# Patient Record
Sex: Male | Born: 1955 | Race: White | Hispanic: No | Smoking: Never smoker
Health system: Southern US, Community
[De-identification: ages and names within clinical notes are randomized; demographics above are authoritative.]

## PROBLEM LIST (undated history)

## (undated) DIAGNOSIS — E079 Disorder of thyroid, unspecified: Secondary | ICD-10-CM

## (undated) DIAGNOSIS — M65331 Trigger finger, right middle finger: Secondary | ICD-10-CM

## (undated) DIAGNOSIS — Z860100 Personal history of colon polyps, unspecified: Secondary | ICD-10-CM

## (undated) DIAGNOSIS — I00 Rheumatic fever without heart involvement: Secondary | ICD-10-CM

## (undated) DIAGNOSIS — I10 Essential (primary) hypertension: Secondary | ICD-10-CM

## (undated) DIAGNOSIS — Z5189 Encounter for other specified aftercare: Secondary | ICD-10-CM

## (undated) DIAGNOSIS — T7840XA Allergy, unspecified, initial encounter: Secondary | ICD-10-CM

## (undated) DIAGNOSIS — Z85810 Personal history of malignant neoplasm of tongue: Secondary | ICD-10-CM

## (undated) DIAGNOSIS — E785 Hyperlipidemia, unspecified: Secondary | ICD-10-CM

## (undated) DIAGNOSIS — E559 Vitamin D deficiency, unspecified: Secondary | ICD-10-CM

## (undated) DIAGNOSIS — R001 Bradycardia, unspecified: Secondary | ICD-10-CM

## (undated) DIAGNOSIS — M7542 Impingement syndrome of left shoulder: Secondary | ICD-10-CM

## (undated) DIAGNOSIS — C760 Malignant neoplasm of head, face and neck: Secondary | ICD-10-CM

## (undated) DIAGNOSIS — K219 Gastro-esophageal reflux disease without esophagitis: Secondary | ICD-10-CM

## (undated) DIAGNOSIS — R0781 Pleurodynia: Secondary | ICD-10-CM

## (undated) DIAGNOSIS — R1310 Dysphagia, unspecified: Secondary | ICD-10-CM

## (undated) DIAGNOSIS — R55 Syncope and collapse: Secondary | ICD-10-CM

## (undated) HISTORY — DX: Dysphagia, unspecified: R13.10

## (undated) HISTORY — DX: Hyperlipidemia, unspecified: E78.5

## (undated) HISTORY — DX: Impingement syndrome of left shoulder: M75.42

## (undated) HISTORY — DX: Allergy, unspecified, initial encounter: T78.40XA

## (undated) HISTORY — DX: Bradycardia, unspecified: R00.1

## (undated) HISTORY — DX: Vitamin D deficiency, unspecified: E55.9

## (undated) HISTORY — DX: Personal history of colon polyps, unspecified: Z86.0100

## (undated) HISTORY — PX: OTHER SURGICAL HISTORY: SHX169

## (undated) HISTORY — DX: Syncope and collapse: R55

## (undated) HISTORY — DX: Gastro-esophageal reflux disease without esophagitis: K21.9

## (undated) HISTORY — DX: Rheumatic fever without heart involvement: I00

## (undated) HISTORY — DX: Personal history of colonic polyps: Z86.010

## (undated) HISTORY — DX: Pleurodynia: R07.81

## (undated) HISTORY — DX: Encounter for other specified aftercare: Z51.89

## (undated) HISTORY — DX: Malignant neoplasm of head, face and neck: C76.0

## (undated) HISTORY — DX: Trigger finger, right middle finger: M65.331

## (undated) HISTORY — DX: Personal history of malignant neoplasm of tongue: Z85.810

## (undated) HISTORY — PX: NECK SURGERY: SHX720

---

## 1998-01-15 DIAGNOSIS — C069 Malignant neoplasm of mouth, unspecified: Secondary | ICD-10-CM

## 1998-01-15 HISTORY — DX: Malignant neoplasm of mouth, unspecified: C06.9

## 1998-09-16 ENCOUNTER — Ambulatory Visit (HOSPITAL_BASED_OUTPATIENT_CLINIC_OR_DEPARTMENT_OTHER): Admission: RE | Admit: 1998-09-16 | Discharge: 1998-09-16 | Payer: Self-pay | Admitting: Periodontics

## 1998-10-06 ENCOUNTER — Encounter: Admission: RE | Admit: 1998-10-06 | Discharge: 1999-01-04 | Payer: Self-pay | Admitting: Radiation Oncology

## 1998-10-06 ENCOUNTER — Encounter (HOSPITAL_COMMUNITY): Admission: RE | Admit: 1998-10-06 | Discharge: 1999-01-04 | Payer: Self-pay | Admitting: Dentistry

## 1998-10-06 ENCOUNTER — Encounter: Payer: Self-pay | Admitting: Radiation Oncology

## 1998-10-26 ENCOUNTER — Inpatient Hospital Stay (HOSPITAL_COMMUNITY): Admission: AD | Admit: 1998-10-26 | Discharge: 1998-10-31 | Payer: Self-pay | Admitting: Hematology and Oncology

## 1998-11-05 ENCOUNTER — Observation Stay (HOSPITAL_COMMUNITY): Admission: AD | Admit: 1998-11-05 | Discharge: 1998-11-05 | Payer: Self-pay | Admitting: Radiation Oncology

## 1998-11-07 ENCOUNTER — Ambulatory Visit (HOSPITAL_COMMUNITY): Admission: RE | Admit: 1998-11-07 | Discharge: 1998-11-07 | Payer: Self-pay | Admitting: Radiation Oncology

## 1999-02-20 ENCOUNTER — Encounter: Payer: Self-pay | Admitting: Hematology and Oncology

## 1999-02-20 ENCOUNTER — Encounter: Admission: RE | Admit: 1999-02-20 | Discharge: 1999-02-20 | Payer: Self-pay | Admitting: Hematology and Oncology

## 1999-03-23 ENCOUNTER — Ambulatory Visit (HOSPITAL_COMMUNITY): Admission: RE | Admit: 1999-03-23 | Discharge: 1999-03-23 | Payer: Self-pay | Admitting: Family Medicine

## 1999-05-26 ENCOUNTER — Encounter (HOSPITAL_COMMUNITY): Admission: RE | Admit: 1999-05-26 | Discharge: 1999-08-24 | Payer: Self-pay | Admitting: Dentistry

## 1999-06-01 ENCOUNTER — Encounter (HOSPITAL_COMMUNITY): Payer: Self-pay | Admitting: Dentistry

## 1999-06-21 ENCOUNTER — Encounter: Payer: Self-pay | Admitting: *Deleted

## 1999-06-21 ENCOUNTER — Encounter: Admission: RE | Admit: 1999-06-21 | Discharge: 1999-06-21 | Payer: Self-pay | Admitting: *Deleted

## 2000-06-20 ENCOUNTER — Ambulatory Visit: Admission: RE | Admit: 2000-06-20 | Discharge: 2000-08-14 | Payer: Self-pay | Admitting: Radiation Oncology

## 2000-08-15 ENCOUNTER — Ambulatory Visit: Admission: RE | Admit: 2000-08-15 | Discharge: 2000-11-13 | Payer: Self-pay | Admitting: Radiation Oncology

## 2001-06-28 ENCOUNTER — Encounter: Payer: Self-pay | Admitting: Emergency Medicine

## 2001-06-28 ENCOUNTER — Emergency Department (HOSPITAL_COMMUNITY): Admission: EM | Admit: 2001-06-28 | Discharge: 2001-06-28 | Payer: Self-pay | Admitting: Emergency Medicine

## 2001-09-22 ENCOUNTER — Encounter: Admission: RE | Admit: 2001-09-22 | Discharge: 2001-09-22 | Payer: Self-pay | Admitting: *Deleted

## 2001-09-22 ENCOUNTER — Encounter: Payer: Self-pay | Admitting: *Deleted

## 2003-08-31 ENCOUNTER — Encounter: Admission: RE | Admit: 2003-08-31 | Discharge: 2003-08-31 | Payer: Self-pay | Admitting: *Deleted

## 2003-09-06 ENCOUNTER — Encounter: Admission: RE | Admit: 2003-09-06 | Discharge: 2003-09-06 | Payer: Self-pay | Admitting: *Deleted

## 2004-09-19 ENCOUNTER — Encounter: Admission: RE | Admit: 2004-09-19 | Discharge: 2004-09-19 | Payer: Self-pay | Admitting: Otolaryngology

## 2005-03-13 ENCOUNTER — Ambulatory Visit: Payer: Self-pay | Admitting: Gastroenterology

## 2005-03-23 ENCOUNTER — Ambulatory Visit: Payer: Self-pay | Admitting: Gastroenterology

## 2005-03-23 ENCOUNTER — Encounter (INDEPENDENT_AMBULATORY_CARE_PROVIDER_SITE_OTHER): Payer: Self-pay | Admitting: Specialist

## 2005-06-22 ENCOUNTER — Ambulatory Visit: Payer: Self-pay | Admitting: Family Medicine

## 2005-10-16 ENCOUNTER — Other Ambulatory Visit: Admission: RE | Admit: 2005-10-16 | Discharge: 2005-10-16 | Payer: Self-pay | Admitting: Otolaryngology

## 2005-10-26 ENCOUNTER — Encounter: Admission: RE | Admit: 2005-10-26 | Discharge: 2005-10-26 | Payer: Self-pay | Admitting: Otolaryngology

## 2005-11-27 ENCOUNTER — Emergency Department (HOSPITAL_COMMUNITY): Admission: EM | Admit: 2005-11-27 | Discharge: 2005-11-27 | Payer: Self-pay | Admitting: Emergency Medicine

## 2006-01-15 HISTORY — PX: SPLENECTOMY, TOTAL: SHX788

## 2006-06-05 ENCOUNTER — Encounter: Admission: RE | Admit: 2006-06-05 | Discharge: 2006-06-05 | Payer: Self-pay | Admitting: Surgery

## 2006-06-25 ENCOUNTER — Ambulatory Visit (HOSPITAL_COMMUNITY): Admission: RE | Admit: 2006-06-25 | Discharge: 2006-06-25 | Payer: Self-pay | Admitting: Surgery

## 2006-07-01 ENCOUNTER — Encounter: Admission: RE | Admit: 2006-07-01 | Discharge: 2006-07-01 | Payer: Self-pay | Admitting: Family Medicine

## 2006-07-25 ENCOUNTER — Encounter: Admission: RE | Admit: 2006-07-25 | Discharge: 2006-07-25 | Payer: Self-pay | Admitting: Surgery

## 2006-12-09 ENCOUNTER — Encounter: Admission: AD | Admit: 2006-12-09 | Discharge: 2006-12-09 | Payer: Self-pay | Admitting: Dentistry

## 2006-12-09 ENCOUNTER — Ambulatory Visit: Payer: Self-pay | Admitting: Dentistry

## 2007-01-30 ENCOUNTER — Encounter: Admission: RE | Admit: 2007-01-30 | Discharge: 2007-01-30 | Payer: Self-pay | Admitting: Neurosurgery
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
## 2010-03-16 DIAGNOSIS — E559 Vitamin D deficiency, unspecified: Secondary | ICD-10-CM | POA: Insufficient documentation

## 2010-03-16 DIAGNOSIS — E039 Hypothyroidism, unspecified: Secondary | ICD-10-CM | POA: Insufficient documentation

## 2010-03-16 DIAGNOSIS — F528 Other sexual dysfunction not due to a substance or known physiological condition: Secondary | ICD-10-CM | POA: Insufficient documentation

## 2010-03-16 HISTORY — DX: Other sexual dysfunction not due to a substance or known physiological condition: F52.8

## 2011-04-27 DIAGNOSIS — I10 Essential (primary) hypertension: Secondary | ICD-10-CM | POA: Insufficient documentation

## 2011-06-18 DIAGNOSIS — M19019 Primary osteoarthritis, unspecified shoulder: Secondary | ICD-10-CM | POA: Insufficient documentation

## 2011-09-22 ENCOUNTER — Emergency Department (HOSPITAL_COMMUNITY)
Admission: EM | Admit: 2011-09-22 | Discharge: 2011-09-22 | Disposition: A | Discharge status: Home / Self Care | Payer: BC Managed Care – PPO | Attending: Emergency Medicine | Admitting: Emergency Medicine

## 2011-09-22 ENCOUNTER — Emergency Department (HOSPITAL_COMMUNITY): Payer: BC Managed Care – PPO

## 2011-09-22 ENCOUNTER — Encounter (HOSPITAL_COMMUNITY): Payer: Self-pay | Admitting: Emergency Medicine

## 2011-09-22 DIAGNOSIS — S62609B Fracture of unspecified phalanx of unspecified finger, initial encounter for open fracture: Secondary | ICD-10-CM

## 2011-09-22 DIAGNOSIS — S62609A Fracture of unspecified phalanx of unspecified finger, initial encounter for closed fracture: Secondary | ICD-10-CM | POA: Insufficient documentation

## 2011-09-22 DIAGNOSIS — S61209A Unspecified open wound of unspecified finger without damage to nail, initial encounter: Secondary | ICD-10-CM | POA: Insufficient documentation

## 2011-09-22 DIAGNOSIS — W010XXA Fall on same level from slipping, tripping and stumbling without subsequent striking against object, initial encounter: Secondary | ICD-10-CM | POA: Insufficient documentation

## 2011-09-22 DIAGNOSIS — W19XXXA Unspecified fall, initial encounter: Secondary | ICD-10-CM

## 2011-09-22 DIAGNOSIS — R209 Unspecified disturbances of skin sensation: Secondary | ICD-10-CM | POA: Insufficient documentation

## 2011-09-22 HISTORY — DX: Essential (primary) hypertension: I10

## 2011-09-22 HISTORY — DX: Disorder of thyroid, unspecified: E07.9

## 2011-09-22 MED ORDER — OXYCODONE-ACETAMINOPHEN 5-325 MG PO TABS
1.0000 | ORAL_TABLET | Freq: Once | ORAL | Status: AC
  Administered 2011-09-22: 1 via ORAL
  Filled 2011-09-22: qty 1

## 2011-09-22 MED ORDER — CEPHALEXIN 500 MG PO CAPS: 500.0000 mg | ORAL_CAPSULE | Freq: Four times a day (QID) | ORAL | Status: AC

## 2011-09-22 MED ORDER — CEPHALEXIN 500 MG PO CAPS
500.0000 mg | ORAL_CAPSULE | Freq: Once | ORAL | Status: AC
  Administered 2011-09-22: 500 mg via ORAL
  Filled 2011-09-22: qty 1

## 2011-09-22 MED ORDER — OXYCODONE-ACETAMINOPHEN 5-325 MG PO TABS: 1.0000 | ORAL_TABLET | ORAL | Status: AC | PRN

## 2011-09-22 NOTE — ED Notes (Signed)
Pt presenting to ed with c/o pushing mower and falling down an embankment pt with laceration to left index finger. Pt with abrasions noted to right knee. Pt with laceration to lip. Pt denies loc. Pt is alert and oriented at this time

## 2011-09-22 NOTE — ED Provider Notes (Signed)
History     CSN: [id number]  Arrival date & time 09/22/11  7846   First MD Initiated Contact with Patient 09/22/11 1011      Chief Complaint  Patient presents with  . Laceration    (Consider location/radiation/quality/duration/timing/severity/associated sxs/prior treatment) HPI Comments: Patient reports he was walking down a hill covered in ivy, pushing a lawnmower, when his foot got caught in the ivy and he fell forward.  States he fell forward while holding onto the lawnmower handle resulting in a large laceration to left index finger and injury to right 5th finger nailbed.  Reports decreased sensation to left index finger distal to injury.  States he also hit his face on cement.  Pt denies any other injury - denies headache, facial pain, neck or back pain.   Denies any dental pain or fracture, denies malocclusion or jaw pain.    Patient is a 56 y.o. male presenting with skin laceration. The history is provided by the patient.  Laceration     Past Medical History  Diagnosis Date  . Hypertension   . Thyroid disease     Past Surgical History  Procedure Date  . Spleenectomy   . Neck surgery     No family history on file.  History  Substance Use Topics  . Smoking status: Never Smoker   . Smokeless tobacco: Not on file  . Alcohol Use: No     occassionally      Review of Systems  HENT: Negative for neck pain.   Musculoskeletal: Negative for back pain.  Neurological: Positive for numbness. Negative for syncope, weakness and headaches.    Allergies  Penicillins  Home Medications  No current outpatient prescriptions on file.  BP 159/100  Pulse 112  Temp 98.6 F (37 C) (Oral)  Resp 21  SpO2 97%  Physical Exam  Nursing note and vitals reviewed. Constitutional: He appears well-developed and well-nourished. No distress.  HENT:  Head: Normocephalic and atraumatic.  Neck: Neck supple.  Pulmonary/Chest: Effort normal.  Musculoskeletal:       Cervical back: He  exhibits no bony tenderness.       Thoracic back: He exhibits no bony tenderness.       Lumbar back: He exhibits no bony tenderness.       Right upper arm: He exhibits no tenderness and no bony tenderness.       Left upper arm: He exhibits no tenderness and no bony tenderness.       Right forearm: He exhibits no tenderness and no bony tenderness.       Left forearm: He exhibits no tenderness and no bony tenderness.       Hands: Neurological: He is alert.       CN II-XII intact, EOMs intact, no pronator drift, grip strengths equal bilaterally; strength 5/5 in all extremities, sensation intact in all extremities; finger to nose, heel to shin, rapid alternating movements normal; gait is normal.     Skin: He is not diaphoretic.    ED Course  Procedures (including critical care time)  Labs Reviewed - No data to display Dg Finger Index Left  09/22/2011  *RADIOLOGY REPORT*  Clinical Data: Laceration  LEFT INDEX FINGER 2+V  Comparison: None.  Findings:  There is a comminuted, compound fracture of the tuft of the distal phalanx of the second digit (index finger).  This finding is associated with a soft tissue defect involving the nail bed.  No definite radiopaque foreign body.  No extension of  the fracture into the DIP joint.  Joint spaces are preserved.  No additional fractures identified.  IMPRESSION: Comminuted, compound fracture of the tuft of the second digit (index finger) with involvement of the nail bed.  No intra- articular extension or radiopaque foreign body.   Original Report Authenticated By: Waynard Reeds, M.D.    Dg Finger Little Right  09/22/2011  *RADIOLOGY REPORT*  Clinical Data: Injury to the nail bed of the right fifth finger. Evaluate for fracture or foreign body.  RIGHT LITTLE FINGER 2+V  Comparison: No priors.  Findings: Three views of the right fifth finger demonstrate a minimally displaced and angulated fracture of the base of the distal phalanx.  Specifically, there appears to  be 2 mm of volar displacement of the distal fracture fragment and approximately 15 degrees of volar angulation.  No other acute fracture is noted. Overlying soft tissues are swollen. No retained radiopaque foreign body in the soft tissues.  IMPRESSION: 1.  Acute minimally displaced and angulated fracture of the base of the fifth distal phalanx.   Original Report Authenticated By: Florencia Reasons, M.D.     10:40 AM Discussed patient with Dr Fonnie Jarvis.   Patient also seen by Dr Fonnie Jarvis who has recommended repair in ED with sutures over skin, dermabond over nail, outpatient hand follow up.    After percocet and digital block, blood pressure much improved, 150s/60s.    LACERATION REPAIR Performed by: Trixie Dredge B   Authorized by: Trixie Dredge B Consent: Verbal consent obtained. Risks and benefits: risks, benefits and alternatives were discussed Consent given by: patient Patient identity confirmed: provided demographic data Prepped and Draped in normal sterile fashion Wound explored  Laceration Location: left index finger  Laceration Length: 3cm  No Foreign Bodies seen or palpated  Anesthesia: digital block.  (Digital block performed by Alena Bills, PA-S)  Local anesthetic: lidocaine 2% no epinephrine  Anesthetic total: 5 ml  Irrigation method: syringe Amount of cleaning: standard  Skin closure: 5-0 prolene  Number of sutures: 9  Technique: simple interrupted  Also used dermabond over nailbed to attempt to hold nail in place.    Patient tolerance: Patient tolerated the procedure well with no immediate complications.   1. Open fracture of finger   2. Fracture of finger   3. Fall      MDM  Patient with mechanical fall today, sustaining open fracture to left index finger (distal phalanx) and fracture of right 5th finger distal phalanx with partial fingernail avulsion.  Pt is neurologically intact.  Denies any other injury, no other obvious injury noted on exam.  Left finger  with mildly decreased sensation but with full AROM and strength, capillary refill < 2 seconds.  Left finger repaired in ED.  Wounds thoroughly cleaned and dressed, finger splints placed.  Tetanus UTD.  Pt d/c home with keflex (first dose given in ED) and hand follow up.  Discussed all results, wound care plan and follow up with patient.  Pt given return precautions.  Pt verbalizes understanding and agrees with plan.           [place], [place] 09/22/11 1553

## 2011-09-22 NOTE — ED Provider Notes (Signed)
Medical screening examination/treatment/procedure(s) were conducted as a shared visit with non-physician practitioner(s) and myself.  I personally evaluated the patient during the encounter  Open Fx lac distal to DIP feel OutPt hand f/u reasonable after wound closure in ED.  Hurman Horn, MD 09/22/11 2056

## 2011-10-01 ENCOUNTER — Encounter: Payer: Self-pay | Admitting: Gastroenterology

## 2011-10-02 ENCOUNTER — Encounter: Payer: Self-pay | Admitting: Gastroenterology

## 2011-10-15 DIAGNOSIS — S62639B Displaced fracture of distal phalanx of unspecified finger, initial encounter for open fracture: Secondary | ICD-10-CM | POA: Insufficient documentation

## 2011-10-15 HISTORY — DX: Displaced fracture of distal phalanx of unspecified finger, initial encounter for open fracture: S62.639B

## 2011-11-02 ENCOUNTER — Ambulatory Visit (AMBULATORY_SURGERY_CENTER): Payer: BC Managed Care – PPO | Admitting: *Deleted

## 2011-11-02 ENCOUNTER — Telehealth: Payer: Self-pay | Admitting: *Deleted

## 2011-11-02 ENCOUNTER — Encounter: Payer: Self-pay | Admitting: Gastroenterology

## 2011-11-02 VITALS — Ht 69.5 in | Wt 206.2 lb

## 2011-11-02 DIAGNOSIS — Z1211 Encounter for screening for malignant neoplasm of colon: Secondary | ICD-10-CM

## 2011-11-02 NOTE — Telephone Encounter (Signed)
Pt scheduled for office visit with Amy Esterwood Tuesday 10/22 at 3:00 pm.  Pt notified of appt.

## 2011-11-02 NOTE — Telephone Encounter (Signed)
Dr. Christella Hartigan: pt scheduled for recall colonoscopy 11/1.  Pt is adamant that he cannot drink MoviPrep because of volume.  He has a history of tongue cancer and he says he has problems drinking large volumes of liquids.  He did Osmoprep with last colonoscopy but is now taking Lisinopril so he cannot take the osmoprep.  Please advise. Thanks, Olegario Messier

## 2011-11-02 NOTE — Telephone Encounter (Signed)
Please schedule him for ROV with me or extender in next 1-2 weeks to discuss adequate prep.  Can keep the 11/1 colonoscopy appt for now.

## 2011-11-06 ENCOUNTER — Ambulatory Visit (INDEPENDENT_AMBULATORY_CARE_PROVIDER_SITE_OTHER): Payer: BC Managed Care – PPO | Admitting: Physician Assistant

## 2011-11-06 ENCOUNTER — Encounter: Payer: Self-pay | Admitting: Gastroenterology

## 2011-11-06 ENCOUNTER — Encounter: Payer: Self-pay | Admitting: Physician Assistant

## 2011-11-06 VITALS — BP 108/68 | HR 72 | Ht 69.5 in | Wt 206.6 lb

## 2011-11-06 DIAGNOSIS — Z85810 Personal history of malignant neoplasm of tongue: Secondary | ICD-10-CM

## 2011-11-06 DIAGNOSIS — Z1211 Encounter for screening for malignant neoplasm of colon: Secondary | ICD-10-CM

## 2011-11-06 DIAGNOSIS — Z860100 Personal history of colon polyps, unspecified: Secondary | ICD-10-CM

## 2011-11-06 DIAGNOSIS — Z9081 Acquired absence of spleen: Secondary | ICD-10-CM | POA: Insufficient documentation

## 2011-11-06 DIAGNOSIS — Z9089 Acquired absence of other organs: Secondary | ICD-10-CM

## 2011-11-06 DIAGNOSIS — I10 Essential (primary) hypertension: Secondary | ICD-10-CM | POA: Insufficient documentation

## 2011-11-06 DIAGNOSIS — Z860101 Personal history of adenomatous and serrated colon polyps: Secondary | ICD-10-CM | POA: Insufficient documentation

## 2011-11-06 MED ORDER — MOVIPREP 100 G PO SOLR: 1.0000 | Freq: Once | ORAL | Status: DC

## 2011-11-06 NOTE — Patient Instructions (Addendum)
You have been scheduled for a colonoscopy. Please follow written instructions given to you at your visit today.  Please pick up your prep kit at the pharmacy within the next 1-3 days. If you use inhalers (even only as needed), please bring them with you on the day of your procedure.  We have sent the following medications to your pharmacy for you to pick up at your convenience: Moviprep
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
## 2011-11-07 NOTE — Progress Notes (Signed)
I agree with the plan outlined in this note 

## 2011-11-16 ENCOUNTER — Ambulatory Visit (AMBULATORY_SURGERY_CENTER): Payer: BC Managed Care – PPO | Admitting: Gastroenterology

## 2011-11-16 ENCOUNTER — Encounter: Payer: Self-pay | Admitting: Gastroenterology

## 2011-11-16 VITALS — BP 101/73 | HR 68 | Temp 96.9°F | Resp 13 | Ht 69.0 in | Wt 206.0 lb

## 2011-11-16 DIAGNOSIS — K5730 Diverticulosis of large intestine without perforation or abscess without bleeding: Secondary | ICD-10-CM

## 2011-11-16 DIAGNOSIS — Z1211 Encounter for screening for malignant neoplasm of colon: Secondary | ICD-10-CM

## 2011-11-16 DIAGNOSIS — D126 Benign neoplasm of colon, unspecified: Secondary | ICD-10-CM

## 2011-11-16 DIAGNOSIS — Z860100 Personal history of colon polyps, unspecified: Secondary | ICD-10-CM

## 2011-11-16 MED ORDER — SODIUM CHLORIDE 0.9 % IV SOLN: 500.0000 mL | INTRAVENOUS | Status: DC

## 2011-11-16 NOTE — Op Note (Signed)
[facility] [address] [place], [address]   COLONOSCOPY PROCEDURE REPORT  PATIENT: Keith Thompson, Keith Thompson  MR#: [id number] BIRTHDATE: 1955/10/12 , 56  yrs. old GENDER: Male ENDOSCOPIST: Rachael Fee, MD PROCEDURE DATE:  11/16/2011 PROCEDURE:   Colonoscopy with snare polypectomy ASA CLASS:   Class III INDICATIONS:2007 adenomatous polyp. MEDICATIONS: Fentanyl 75 mcg IV, Versed 7 mg IV, and Zofran 4 mg IV   DESCRIPTION OF PROCEDURE:   After the risks benefits and alternatives of the procedure were thoroughly explained, informed consent was obtained.  A digital rectal exam revealed no abnormalities of the rectum.   The LB [id number]  endoscope was introduced through the anus and advanced to the cecum, which was identified by both the appendix and ileocecal valve. No adverse events experienced.   The quality of the prep was good.  The instrument was then slowly withdrawn as the colon was fully examined.  COLON FINDINGS: Three small polyps were found, all were removed and all were sent to pathology.  These were 2-22mm across, sessile, located in ascending and rectal segments.   Mild diverticulosis was noted in the left colon.   The colon mucosa was otherwise normal. Retroflexed views revealed no abnormalities. The time to cecum=2 minutes 08 seconds.  Withdrawal time=9 minutes 30 seconds.  The scope was withdrawn and the procedure completed. COMPLICATIONS: There were no complications.  ENDOSCOPIC IMPRESSION: 1.   Three small polyps were found, all were removed and all were sent to pathology. 2.   Mild diverticulosis was noted in the left colon 3.   The colon mucosa was otherwise normal  RECOMMENDATIONS: 1.  Given your personal history of adenomatous (pre-cancerous) polyps, you will need a repeat colonoscopy in 3-5 years even if the polyps removed today are not precancerous 2.  You will receive a letter within 1-2 weeks with the results of your biopsy as  well as final recommendations.  Please call my office if you have not received a letter after 3 weeks.   eSigned:  Rachael Fee, MD 11/16/2011 9:43 AM   cc: Maryelizabeth Rowan, MD

## 2011-11-16 NOTE — Progress Notes (Signed)
Patient did not experience any of the following events: a burn prior to discharge; a fall within the facility; wrong site/side/patient/procedure/implant event; or a hospital transfer or hospital admission upon discharge from the facility. (G8907) Patient did not have preoperative order for IV antibiotic SSI prophylaxis. (G8918)  

## 2011-11-16 NOTE — Patient Instructions (Addendum)
Colon polyps x 3 removed today, diverticulosis seen. See handouts. Resume current medications. Call us with any questions or concerns. Thank you!!   YOU HAD AN ENDOSCOPIC PROCEDURE TODAY AT THE [facility]: Refer to the procedure report that was given to you for any specific questions about what was found during the examination.  If the procedure report does not answer your questions, please call your gastroenterologist to clarify.  If you requested that your care partner not be given the details of your procedure findings, then the procedure report has been included in a sealed envelope for you to review at your convenience later.  YOU SHOULD EXPECT: Some feelings of bloating in the abdomen. Passage of more gas than usual.  Walking can help get rid of the air that was put into your GI tract during the procedure and reduce the bloating. If you had a lower endoscopy (such as a colonoscopy or flexible sigmoidoscopy) you may notice spotting of blood in your stool or on the toilet paper. If you underwent a bowel prep for your procedure, then you may not have a normal bowel movement for a few days.  DIET: Your first meal following the procedure should be a light meal and then it is ok to progress to your normal diet.  A half-sandwich or bowl of soup is an example of a good first meal.  Heavy or fried foods are harder to digest and may make you feel nauseous or bloated.  Likewise meals heavy in dairy and vegetables can cause extra gas to form and this can also increase the bloating.  Drink plenty of fluids but you should avoid alcoholic beverages for 24 hours.  ACTIVITY: Your care partner should take you home directly after the procedure.  You should plan to take it easy, moving slowly for the rest of the day.  You can resume normal activity the day after the procedure however you should NOT DRIVE or use heavy machinery for 24 hours (because of the sedation medicines used during the test).     SYMPTOMS TO REPORT IMMEDIATELY: A gastroenterologist can be reached at any hour.  During normal business hours, 8:30 AM to 5:00 PM Monday through Friday, call [phone].  After hours and on weekends, please call the GI answering service at [phone] who will take a message and have the physician on call contact you.   Following lower endoscopy (colonoscopy or flexible sigmoidoscopy):  Excessive amounts of blood in the stool  Significant tenderness or worsening of abdominal pains  Swelling of the abdomen that is new, acute  Fever of 100F or higher  FOLLOW UP: If any biopsies were taken you will be contacted by phone or by letter within the next 1-3 weeks.  Call your gastroenterologist if you have not heard about the biopsies in 3 weeks.  Our staff will call the home number listed on your records the next business day following your procedure to check on you and address any questions or concerns that you may have at that time regarding the information given to you following your procedure. This is a courtesy call and so if there is no answer at the home number and we have not heard from you through the emergency physician on call, we will assume that you have returned to your regular daily activities without incident.  SIGNATURES/CONFIDENTIALITY: You and/or your care partner have signed paperwork which will be entered into your electronic medical record.  These signatures attest to the fact  that that the information above on your After Visit Summary has been reviewed and is understood.  Full responsibility of the confidentiality of this discharge information lies with you and/or your care-partner.

## 2011-11-19 ENCOUNTER — Telehealth: Payer: Self-pay

## 2011-11-19 NOTE — Telephone Encounter (Signed)
Left message on answering machine. 

## 2011-11-21 ENCOUNTER — Encounter: Payer: Self-pay | Admitting: Gastroenterology

## 2011-11-29 DIAGNOSIS — K5790 Diverticulosis of intestine, part unspecified, without perforation or abscess without bleeding: Secondary | ICD-10-CM

## 2011-11-29 DIAGNOSIS — K635 Polyp of colon: Secondary | ICD-10-CM | POA: Insufficient documentation

## 2011-11-29 HISTORY — DX: Diverticulosis of intestine, part unspecified, without perforation or abscess without bleeding: K57.90

## 2014-12-21 ENCOUNTER — Ambulatory Visit
Admission: RE | Admit: 2014-12-21 | Discharge: 2014-12-21 | Disposition: A | Discharge status: Home / Self Care | Payer: BLUE CROSS/BLUE SHIELD | Source: Ambulatory Visit | Attending: Family Medicine | Admitting: Family Medicine

## 2014-12-21 ENCOUNTER — Other Ambulatory Visit: Payer: Self-pay | Admitting: Family Medicine

## 2015-03-17 DIAGNOSIS — R1310 Dysphagia, unspecified: Secondary | ICD-10-CM | POA: Insufficient documentation

## 2015-03-17 DIAGNOSIS — E875 Hyperkalemia: Secondary | ICD-10-CM | POA: Insufficient documentation

## 2015-03-17 DIAGNOSIS — E890 Postprocedural hypothyroidism: Secondary | ICD-10-CM | POA: Insufficient documentation

## 2015-03-17 DIAGNOSIS — J029 Acute pharyngitis, unspecified: Secondary | ICD-10-CM | POA: Insufficient documentation

## 2015-03-17 DIAGNOSIS — R718 Other abnormality of red blood cells: Secondary | ICD-10-CM | POA: Insufficient documentation

## 2015-03-17 DIAGNOSIS — R7989 Other specified abnormal findings of blood chemistry: Secondary | ICD-10-CM | POA: Insufficient documentation

## 2015-03-17 DIAGNOSIS — L0390 Cellulitis, unspecified: Secondary | ICD-10-CM | POA: Insufficient documentation

## 2015-03-17 DIAGNOSIS — R0789 Other chest pain: Secondary | ICD-10-CM | POA: Insufficient documentation

## 2015-12-10 ENCOUNTER — Encounter (HOSPITAL_COMMUNITY): Payer: Self-pay | Admitting: Emergency Medicine

## 2015-12-10 ENCOUNTER — Emergency Department (HOSPITAL_COMMUNITY)
Admission: EM | Admit: 2015-12-10 | Discharge: 2015-12-10 | Disposition: A | Discharge status: Home / Self Care | Payer: BLUE CROSS/BLUE SHIELD | Attending: Emergency Medicine | Admitting: Emergency Medicine

## 2015-12-10 ENCOUNTER — Emergency Department (HOSPITAL_COMMUNITY): Payer: BLUE CROSS/BLUE SHIELD

## 2015-12-10 DIAGNOSIS — W1789XA Other fall from one level to another, initial encounter: Secondary | ICD-10-CM | POA: Diagnosis not present

## 2015-12-10 DIAGNOSIS — Y939 Activity, unspecified: Secondary | ICD-10-CM | POA: Diagnosis not present

## 2015-12-10 DIAGNOSIS — Z85810 Personal history of malignant neoplasm of tongue: Secondary | ICD-10-CM | POA: Diagnosis not present

## 2015-12-10 DIAGNOSIS — I10 Essential (primary) hypertension: Secondary | ICD-10-CM | POA: Diagnosis not present

## 2015-12-10 DIAGNOSIS — S93401A Sprain of unspecified ligament of right ankle, initial encounter: Secondary | ICD-10-CM

## 2015-12-10 DIAGNOSIS — Z79899 Other long term (current) drug therapy: Secondary | ICD-10-CM | POA: Diagnosis not present

## 2015-12-10 DIAGNOSIS — S99911A Unspecified injury of right ankle, initial encounter: Secondary | ICD-10-CM | POA: Diagnosis present

## 2015-12-10 DIAGNOSIS — Y999 Unspecified external cause status: Secondary | ICD-10-CM | POA: Insufficient documentation

## 2015-12-10 DIAGNOSIS — Y929 Unspecified place or not applicable: Secondary | ICD-10-CM | POA: Insufficient documentation

## 2015-12-10 NOTE — Progress Notes (Signed)
Orthopedic Tech Progress Note Patient Details:  Keith Thompson 22-Sep-1955 TX:[id number]  Ortho Devices Type of Ortho Device: ASO Ortho Device/Splint Location: Rt Ankle Ortho Device/Splint Interventions: Ordered, Application   [name] 12/10/2015, 12:04 PM

## 2015-12-10 NOTE — ED Triage Notes (Signed)
Patient here from home with complaints of right ankle pain after fall. Reports that he was stepping down out of truck and the gravel was uneven. Pain 5/10. Ambulatory.

## 2015-12-10 NOTE — ED Provider Notes (Signed)
[facility] DEPT Provider Note   CSN: [id number] Arrival date & time: 12/10/15  1037     History   Chief Complaint Chief Complaint  Patient presents with  . Ankle Pain    HPI Keith Thompson is a 60 y.o. male.  HPI   60 year old male presents to the ankle pain. Patient reports he was getting out of his truck this morning when his ankle on concrete. He notes pain to the vehicle aspect of the ankle, reports some pain with palpation but no difficulty. Patient reports minor swelling to the ankle, denies any open wounds. No medications prior to arrival. No pain in the knee or the remaining lower extremity.  Past Medical History:  Diagnosis Date  . History of colon polyps   . History of tongue cancer    2000; chemo and radiation  . Hypertension   . Thyroid disease     Patient Active Problem List   Diagnosis Date Noted  . HTN (hypertension) 11/06/2011  . Hx of adenomatous colonic polyps 11/06/2011  . 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 Hx of tongue cancer 11/06/2011    Past Surgical History:  Procedure Laterality Date  . NECK SURGERY  lymph node resection and excision of tongue lesion 2000-pt had subsequent radiation  . spleenectomy         Home Medications    Prior to Admission medications   Medication Sig Start Date End Date Taking? Authorizing Provider  cholecalciferol (VITAMIN D) 1000 UNITS tablet Take 1,000 Units by mouth at bedtime.    Historical Provider, MD  levothyroxine (SYNTHROID, LEVOTHROID) 150 MCG tablet Take 150 mcg by mouth daily.    Historical Provider, MD  lisinopril (PRINIVIL,ZESTRIL) 2.5 MG tablet Take 5 mg by mouth at bedtime.     Historical Provider, MD  SF 5000 PLUS 1.1 % CREA dental cream Place 1 application onto teeth at bedtime.  10/09/11   Historical Provider, MD    Family History Family History  Problem Relation Age of Onset  . Colon cancer Neg Hx   . Stomach cancer Neg Hx     Social History Social History  Substance Use Topics  .  Smoking status: Never Smoker  . Smokeless tobacco: Never Used  . Alcohol use No     Comment: occassionally     Allergies   Penicillins   Review of Systems Review of Systems  All other systems reviewed and are negative.    Physical Exam Updated Vital Signs BP 135/86 (BP Location: Left Arm)   Pulse 64   Temp 98.5 F (36.9 C) (Oral)   Resp 16   SpO2 99%   Physical Exam  Constitutional: He is oriented to person, place, and time. He appears well-developed and well-nourished.  HENT:  Head: Normocephalic and atraumatic.  Eyes: Conjunctivae are normal. Pupils are equal, round, and reactive to light. Right eye exhibits no discharge. Left eye exhibits no discharge. No scleral icterus.  Neck: Normal range of motion. No JVD present. No tracheal deviation present.  Pulmonary/Chest: Effort normal. No stridor.  Musculoskeletal:  Very minor swelling to the medial ankle, no open wounds, tenderness to palpation of medial malleolus, full active range of motion of the ankle, pedal pulses 2+, sensation intact. Foot are atraumatic and nontender, no tenderness to remainder of lower extremity  Neurological: He is alert and oriented to person, place, and time. Coordination normal.  Psychiatric: He has a normal mood and affect. His behavior is normal. Judgment and thought content normal.  Nursing note  and vitals reviewed.    ED Treatments / Results  Labs (all labs ordered are listed, but only abnormal results are displayed) Labs Reviewed - No data to display  EKG  EKG Interpretation None       Radiology Dg Ankle Complete Right  Result Date: 12/10/2015 CLINICAL DATA:  Slip and fall with pain and swelling, initial encounter EXAM: RIGHT ANKLE - COMPLETE 3+ VIEW COMPARISON:  None. FINDINGS: No acute fracture is noted. Well corticated bony densities are noted adjacent to the lateral malleolus consistent with prior fracture and nonunion. Calcaneal spurring is noted. No other focal  abnormality is seen. IMPRESSION: No acute abnormality noted. Electronically Signed   By: Inez Catalina M.D.   On: 12/10/2015 11:33    Procedures Procedures (including critical care time)  Medications Ordered in ED Medications - No data to display   Initial Impression / Assessment and Plan / ED Course  I have reviewed the triage vital signs and the nursing notes.  Pertinent labs & imaging results that were available during my care of the patient were reviewed by me and considered in my medical decision making (see chart for details).  Clinical Course     Patient presents with ankle sprain. No significant laxity, and placed an ASO, encouraged follow-up with primary care symptoms persist.  Final Clinical Impressions(s) / ED Diagnoses   Final diagnoses:  Sprain of right ankle, unspecified ligament, initial encounter    New Prescriptions New Prescriptions   No medications on file     Okey Regal, PA-C 12/10/15 [place], MD 12/11/15 [phone]

## 2015-12-10 NOTE — Discharge Instructions (Signed)
Please read attached information. If you experience any new or worsening signs or symptoms please return to the emergency room for evaluation. Please follow-up with your primary care provider or specialist as discussed.  °

## 2015-12-10 NOTE — ED Notes (Signed)
Patient is alert and oriented x3.  He was given DC instructions and follow up visit instructions.  Patient gave verbal understanding.  He was DC ambulatory under his own power to home.  V/S stable.  He was not showing any signs of distress on DC 

## 2016-03-23 ENCOUNTER — Telehealth: Payer: Self-pay | Admitting: Cardiology

## 2016-03-23 NOTE — Telephone Encounter (Signed)
Faxed notes to [facility].

## 2016-03-25 NOTE — Progress Notes (Signed)
Cardiology Office Note    Date:  03/26/2016   ID:  Keith Thompson, DOB 06/17/1955, MRN [id number]  PCP:  Keith Cipro, MD  Cardiologist:  Keith Teagarden [place], MD    History of Present Illness:  Keith Thompson is a 61 y.o. male seen at the request of Dr. Ernie Hew for evaluation of syncope and bradycardia. He states that 3 weeks ago he got up out of bed to go to bathroom. He took 3 steps then became swimmy headed. He reached for door but hit the floor. He thinks he may have passed out transiently but came to as soon as he hit the floor. No nausea, sweating, chest pain, SOB. States he had treatment for head and neck surgery 17 years ago including surgery, RT, and chemotherapy. Type of chemo unknown. He states that ever since then he has some orthostatic intolerance with lightheadedness when he stands or after bending over. No history of murmur. He does have a history of HTN. When seen recently by Dr. Ernie Hew was noted to be bradycardic (HR unclear) and did have some orthostatic BP changes.  Past Medical History:  Diagnosis Date  . Cancer of head and neck ([place])   . History of colon polyps   . History of tongue cancer    2000; chemo and radiation  . Hypertension   . Syncope and collapse   . Thyroid disease     Past Surgical History:  Procedure Laterality Date  . NECK SURGERY  lymph node resection and excision of tongue lesion 2000-pt had subsequent radiation  . spleenectomy      Current Medications: Outpatient Medications Prior to Visit  Medication Sig Dispense Refill  . cholecalciferol (VITAMIN D) 1000 UNITS tablet Take 1,000 Units by mouth at bedtime.    Marland Kitchen lisinopril (PRINIVIL,ZESTRIL) 2.5 MG tablet Take 5 mg by mouth at bedtime.     . SF 5000 PLUS 1.1 % CREA dental cream Place 1 application onto teeth at bedtime.     Marland Kitchen levothyroxine (SYNTHROID, LEVOTHROID) 150 MCG tablet Take 150 mcg by mouth daily.     No facility-administered medications prior to visit.      Allergies:   Penicillins    Social History   Social History  . Marital status: Married    Spouse name: Keith Thompson  . Number of children: 1  . Years of education: Keith Thompson   Occupational History  . Self employed- lawn care    Social History Main Topics  . Smoking status: Never Smoker  . Smokeless tobacco: Never Used  . Alcohol use No     Comment: occassionally  . Drug use: No  . Sexual activity: Not Asked   Other Topics Concern  . None   Social History Narrative  . None     Family History:  The patient's family history includes Alzheimer's disease in his father; Hypertension in his brother; Lung cancer in his mother; Stroke in his father.   ROS:   Please see the history of present illness.    ROS All other systems reviewed and are negative.   PHYSICAL EXAM:   VS:  BP 114/88   Pulse 73   Ht 5\' 9"  (1.753 m)   Wt 213 lb (96.6 kg)   BMI 31.45 kg/m    GEN: Well nourished, well developed, in no acute distress  HEENT: normal  Neck: no JVD, carotid bruits, or masses. Old left neck scar. Cardiac: RRR; no murmurs, rubs, or gallops,no edema  Respiratory:  clear to auscultation  bilaterally, normal work of breathing GI: soft, nontender, nondistended, + BS MS: no deformity or atrophy  Skin: warm and dry, no rash Neuro:  Alert and Oriented x 3, Strength and sensation are intact Psych: euthymic mood, full affect  Wt Readings from Last 3 Encounters:  03/26/16 213 lb (96.6 kg)  11/16/11 206 lb (93.4 kg)  11/06/11 206 lb 9.6 oz (93.7 kg)      Studies/Labs Reviewed:   EKG:  EKG is ordered today.  The ekg ordered today demonstrates NSR rate 73. Incomplete RBBB. Possible LVH. I have personally reviewed and interpreted this study.   Recent Labs: No results found for requested labs within last 8760 hours.   Lipid Panel No results found for: CHOL, TRIG, HDL, CHOLHDL, VLDL, LDLCALC, LDLDIRECT  Additional studies/ records that were reviewed today include:  Labs dated 03/15/16: CMET, TSH, CBC normal.     ASSESSMENT:    1. Essential hypertension   2. Near syncope      PLAN:  In order of problems listed above:  1. I suspect his near syncope is related to chronic orthostatic intolerance. It is possible his prior RT may have had some impact on his carotid baroreceptor. We will have him wear a 24 hour Holter monitor to rule out significant arrhythmia. If this is normal then I would focus on conservative Rx of his orthostatic symptoms. We discussed methods of avoidance. Needs to stay well hydrated. If symptoms get worse could consider compression hose or abdominal binder.     Medication Adjustments/Labs and Tests Ordered: Current medicines are reviewed at length with the patient today.  Concerns regarding medicines are outlined above.  Medication changes, Labs and Tests ordered today are listed in the Patient Instructions below. Patient Instructions  We will have you wear a monitor for one day  Focus on maintaining good hydration and stand slowly.      Signed, Ranveer Wahlstrom [place], MD  03/26/2016 12:13 PM    [facility] [address], [place], [place], [address] [phone]

## 2016-03-26 ENCOUNTER — Encounter: Payer: Self-pay | Admitting: Cardiology

## 2016-03-26 ENCOUNTER — Ambulatory Visit (INDEPENDENT_AMBULATORY_CARE_PROVIDER_SITE_OTHER): Payer: BLUE CROSS/BLUE SHIELD | Admitting: Cardiology

## 2016-03-26 VITALS — BP 114/88 | HR 73 | Ht 69.0 in | Wt 213.0 lb

## 2016-03-26 DIAGNOSIS — R55 Syncope and collapse: Secondary | ICD-10-CM | POA: Diagnosis not present

## 2016-03-26 DIAGNOSIS — I10 Essential (primary) hypertension: Secondary | ICD-10-CM | POA: Diagnosis not present

## 2016-03-26 NOTE — Patient Instructions (Signed)
We will have you wear a monitor for one day  Focus on maintaining good hydration and stand slowly.

## 2016-03-29 ENCOUNTER — Ambulatory Visit (INDEPENDENT_AMBULATORY_CARE_PROVIDER_SITE_OTHER): Payer: BLUE CROSS/BLUE SHIELD

## 2016-03-29 DIAGNOSIS — R55 Syncope and collapse: Secondary | ICD-10-CM

## 2016-03-29 DIAGNOSIS — I10 Essential (primary) hypertension: Secondary | ICD-10-CM

## 2016-04-13 ENCOUNTER — Telehealth: Payer: Self-pay

## 2016-04-13 NOTE — Telephone Encounter (Signed)
Called patient left message on personal voice mail he only has to follow up with Dr.Jordan as needed.

## 2016-12-03 ENCOUNTER — Encounter: Payer: Self-pay | Admitting: Gastroenterology

## 2016-12-10 ENCOUNTER — Encounter: Payer: Self-pay | Admitting: Gastroenterology

## 2017-01-22 ENCOUNTER — Other Ambulatory Visit: Payer: Self-pay

## 2017-01-22 ENCOUNTER — Ambulatory Visit (AMBULATORY_SURGERY_CENTER): Payer: Self-pay

## 2017-01-22 VITALS — Ht 70.8 in | Wt 216.0 lb

## 2017-01-22 DIAGNOSIS — Z1211 Encounter for screening for malignant neoplasm of colon: Secondary | ICD-10-CM

## 2017-01-22 MED ORDER — PEG 3350-KCL-NA BICARB-NACL 420 G PO SOLR: 4000.0000 mL | Freq: Once | ORAL | 0 refills | Status: AC

## 2017-01-26 DIAGNOSIS — M6530 Trigger finger, unspecified finger: Secondary | ICD-10-CM | POA: Insufficient documentation

## 2017-01-29 ENCOUNTER — Encounter: Payer: Self-pay | Admitting: Gastroenterology

## 2017-02-05 ENCOUNTER — Ambulatory Visit (AMBULATORY_SURGERY_CENTER): Payer: BLUE CROSS/BLUE SHIELD | Admitting: Gastroenterology

## 2017-02-05 ENCOUNTER — Encounter: Payer: Self-pay | Admitting: Gastroenterology

## 2017-02-05 ENCOUNTER — Other Ambulatory Visit: Payer: Self-pay

## 2017-02-05 VITALS — BP 120/87 | HR 75 | Temp 98.2°F | Resp 21 | Ht 70.0 in | Wt 216.0 lb

## 2017-02-05 DIAGNOSIS — Z1211 Encounter for screening for malignant neoplasm of colon: Secondary | ICD-10-CM

## 2017-02-05 DIAGNOSIS — K5730 Diverticulosis of large intestine without perforation or abscess without bleeding: Secondary | ICD-10-CM | POA: Diagnosis not present

## 2017-02-05 DIAGNOSIS — D123 Benign neoplasm of transverse colon: Secondary | ICD-10-CM | POA: Diagnosis not present

## 2017-02-05 DIAGNOSIS — Z1212 Encounter for screening for malignant neoplasm of rectum: Secondary | ICD-10-CM | POA: Diagnosis not present

## 2017-02-05 DIAGNOSIS — K635 Polyp of colon: Secondary | ICD-10-CM | POA: Diagnosis not present

## 2017-02-05 MED ORDER — SODIUM CHLORIDE 0.9 % IV SOLN: 500.0000 mL | Freq: Once | INTRAVENOUS | Status: DC

## 2017-02-05 NOTE — Progress Notes (Signed)
Report to PACU, RN, vss, BBS= Clear.  

## 2017-02-05 NOTE — Op Note (Signed)
[facility] Patient Name: Keith Thompson Procedure Date: 02/05/2017 8:03 AM MRN: [id number] Endoscopist: Milus Banister , MD Age: 62 Referring MD:  Date of Birth: 1955-04-04 Gender: Male Account #: [id number] Procedure:                Colonoscopy Indications:              High risk colon cancer surveillance: Personal                            history of colonic polyps; colonoscopy 2007 found                            one subCM TVA; colonoscopy 2013 found three subCM                            poylps, one was TA Medicines:                Monitored Anesthesia Care Procedure:                Pre-Anesthesia Assessment:                           - Prior to the procedure, a History and Physical                            was performed, and patient medications and                            allergies were reviewed. The patient's tolerance of                            previous anesthesia was also reviewed. The risks                            and benefits of the procedure and the sedation                            options and risks were discussed with the patient.                            All questions were answered, and informed consent                            was obtained. Prior Anticoagulants: The patient has                            taken no previous anticoagulant or antiplatelet                            agents. ASA Grade Assessment: II - A patient with                            mild systemic disease. After reviewing the risks  and benefits, the patient was deemed in                            satisfactory condition to undergo the procedure.                           After obtaining informed consent, the colonoscope                            was passed under direct vision. Throughout the                            procedure, the patient's blood pressure, pulse, and                            oxygen saturations were monitored continuously.  The                            Colonoscope was introduced through the anus and                            advanced to the the cecum, identified by                            appendiceal orifice and ileocecal valve. The                            colonoscopy was performed without difficulty. The                            patient tolerated the procedure well. The quality                            of the bowel preparation was excellent. The                            ileocecal valve, appendiceal orifice, and rectum                            were photographed. Scope In: 8:08:07 AM Scope Out: 8:23:11 AM Scope Withdrawal Time: 0 hours 12 minutes 51 seconds  Total Procedure Duration: 0 hours 15 minutes 4 seconds  Findings:                 A 2 mm polyp was found in the transverse colon. The                            polyp was sessile. The polyp was removed with a                            cold snare. Resection and retrieval were complete.                           Multiple small-mouthed diverticula were found in  the left colon.                           The exam was otherwise without abnormality on                            direct and retroflexion views. Complications:            No immediate complications. Estimated blood loss:                            None. Estimated Blood Loss:     Estimated blood loss: none. Impression:               - One 2 mm polyp in the transverse colon, removed                            with a cold snare. Resected and retrieved.                           - Diverticulosis in the left colon.                           - The examination was otherwise normal on direct                            and retroflexion views. Recommendation:           - Patient has a contact number available for                            emergencies. The signs and symptoms of potential                            delayed complications were discussed with the                             patient. Return to normal activities tomorrow.                            Written discharge instructions were provided to the                            patient.                           - Resume previous diet.                           - Continue present medications.                           You will receive a letter within 2-3 weeks with the                            pathology results and my final recommendations.  If the polyp(s) is proven to be 'pre-cancerous' on                            pathology, you will need repeat colonoscopy in 5                            years. Milus Banister, MD 02/05/2017 8:26:13 AM This report has been signed electronically.

## 2017-02-05 NOTE — Patient Instructions (Signed)
Information on polyps and diver ticulosis given  YOU HAD AN ENDOSCOPIC PROCEDURE TODAY AT [facility]:   Refer to the procedure report that was given to you for any specific questions about what was found during the examination.  If the procedure report does not answer your questions, please call your gastroenterologist to clarify.  If you requested that your care partner not be given the details of your procedure findings, then the procedure report has been included in a sealed envelope for you to review at your convenience later.  YOU SHOULD EXPECT: Some feelings of bloating in the abdomen. Passage of more gas than usual.  Walking can help get rid of the air that was put into your GI tract during the procedure and reduce the bloating. If you had a lower endoscopy (such as a colonoscopy or flexible sigmoidoscopy) you may notice spotting of blood in your stool or on the toilet paper. If you underwent a bowel prep for your procedure, you may not have a normal bowel movement for a few days.  Please Note:  You might notice some irritation and congestion in your nose or some drainage.  This is from the oxygen used during your procedure.  There is no need for concern and it should clear up in a day or so.  SYMPTOMS TO REPORT IMMEDIATELY:   Following lower endoscopy (colonoscopy or flexible sigmoidoscopy):  Excessive amounts of blood in the stool  Significant tenderness or worsening of abdominal pains  Swelling of the abdomen that is new, acute  Fever of 100F or higher    For urgent or emergent issues, a gastroenterologist can be reached at any hour by calling [phone].   DIET:  We do recommend a small meal at first, but then you may proceed to your regular diet.  Drink plenty of fluids but you should avoid alcoholic beverages for 24 hours.  ACTIVITY:  You should plan to take it easy for the rest of today and you should NOT DRIVE or use heavy machinery until tomorrow  (because of the sedation medicines used during the test).    FOLLOW UP: Our staff will call the number listed on your records the next business day following your procedure to check on you and address any questions or concerns that you may have regarding the information given to you following your procedure. If we do not reach you, we will leave a message.  However, if you are feeling well and you are not experiencing any problems, there is no need to return our call.  We will assume that you have returned to your regular daily activities without incident.  If any biopsies were taken you will be contacted by phone or by letter within the next 1-3 weeks.  Please call us at [phone] if you have not heard about the biopsies in 3 weeks.    SIGNATURES/CONFIDENTIALITY: You and/or your care partner have signed paperwork which will be entered into your electronic medical record.  These signatures attest to the fact that that the information above on your After Visit Summary has been reviewed and is understood.  Full responsibility of the confidentiality of this discharge information lies with you and/or your care-partner.

## 2017-02-05 NOTE — Progress Notes (Signed)
Pt's states no medical or surgical changes since previsit or office visit. 

## 2017-02-06 ENCOUNTER — Telehealth: Payer: Self-pay | Admitting: *Deleted

## 2017-02-06 NOTE — Telephone Encounter (Signed)
  Follow up Call-  Call back number 02/05/2017  Post procedure Call Back phone  # [phone] cell  Permission to leave phone message Yes  Some recent data might be hidden     Patient questions:  Do you have a fever, pain , or abdominal swelling? No. Pain Score  0 *  Have you tolerated food without any problems? Yes.    Have you been able to return to your normal activities? Yes.    Do you have any questions about your discharge instructions: Diet   No. Medications  No. Follow up visit  No.  Do you have questions or concerns about your Care? No.  Actions: * If pain score is 4 or above: No action needed, pain <4.

## 2017-02-10 ENCOUNTER — Encounter: Payer: Self-pay | Admitting: Gastroenterology

## 2017-02-15 HISTORY — PX: COLONOSCOPY: SHX174

## 2017-08-29 DIAGNOSIS — R490 Dysphonia: Secondary | ICD-10-CM | POA: Insufficient documentation

## 2017-08-29 DIAGNOSIS — R061 Stridor: Secondary | ICD-10-CM | POA: Insufficient documentation

## 2017-08-29 DIAGNOSIS — J386 Stenosis of larynx: Secondary | ICD-10-CM | POA: Insufficient documentation

## 2017-08-29 HISTORY — DX: Dysphonia: R49.0

## 2017-08-29 HISTORY — DX: Stridor: R06.1

## 2018-05-16 DIAGNOSIS — I10 Essential (primary) hypertension: Secondary | ICD-10-CM | POA: Diagnosis not present

## 2018-05-16 DIAGNOSIS — E559 Vitamin D deficiency, unspecified: Secondary | ICD-10-CM | POA: Diagnosis not present

## 2018-05-16 DIAGNOSIS — R1310 Dysphagia, unspecified: Secondary | ICD-10-CM | POA: Diagnosis not present

## 2018-05-16 DIAGNOSIS — E039 Hypothyroidism, unspecified: Secondary | ICD-10-CM | POA: Diagnosis not present

## 2018-05-16 DIAGNOSIS — E782 Mixed hyperlipidemia: Secondary | ICD-10-CM | POA: Diagnosis not present

## 2018-06-04 ENCOUNTER — Other Ambulatory Visit (HOSPITAL_COMMUNITY): Payer: Self-pay

## 2018-06-04 ENCOUNTER — Ambulatory Visit (INDEPENDENT_AMBULATORY_CARE_PROVIDER_SITE_OTHER): Payer: Medicare HMO | Admitting: Gastroenterology

## 2018-06-04 ENCOUNTER — Other Ambulatory Visit: Payer: Self-pay

## 2018-06-04 ENCOUNTER — Encounter: Payer: Self-pay | Admitting: Gastroenterology

## 2018-06-04 VITALS — Ht 69.0 in | Wt 195.0 lb

## 2018-06-04 DIAGNOSIS — R1310 Dysphagia, unspecified: Secondary | ICD-10-CM

## 2018-06-04 DIAGNOSIS — Z85810 Personal history of malignant neoplasm of tongue: Secondary | ICD-10-CM

## 2018-06-04 NOTE — Progress Notes (Signed)
Review of pertinent gastrointestinal problems: 1. Adenomatous polyps: colonoscopy 2007 found one subCM TVA; colonoscopy 2013 found three subCM poylps, one was TA. Colonoscopy Dr. Ardis Hughs 01/2017 single subCM polyp removed, also diverticulosis, the polyps was hyperplastic, given TVA in past I recommended recall at 5 year interval.    This service was provided via virtual visit.  Only audio was used.  The patient was located at home.  I was located in my office.  The patient did consent to this virtual visit and is aware of possible charges through their insurance for this visit.  The patient is a new patient.  My certified medical assistant, Grace Bushy, contributed to this visit by contacting the patient by phone 1 or 2 business days prior to the appointment and also followed up on the recommendations I made after the visit.  Time spent on virtual visit: 26 minutes   HPI: This is a very pleasant 63 year old man whomI last saw him at the time of a colonoscopy January 2019.  See those results summarized above.  He is here today to discuss a new problem.  He has trouble with choking and some dysphasia-like symptoms.  These seem to be getting worse.  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  He has very little saliva since then.  He underwent an ear nose and throat examination last year by Dr. Redmond Baseman who told him he had some scar tissue.  I am not sure exactly where that scar tissue was.  He does not get heartburn.  Weight 7-8 pounds, working more on [employer] lately.   Chief complaint is choking, dysphasia  ROS: complete GI ROS as described in HPI, all other review negative.  Constitutional:  No unintentional weight loss   Past Medical History:  Diagnosis Date  . Allergy   . Blood transfusion without reported diagnosis   . 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 History of colon polyps   . History of tongue cancer    2000; chemo and  radiation  . Hypertension   . Syncope and collapse   . Thyroid disease     Past Surgical History:  Procedure Laterality Date  . NECK SURGERY  lymph node resection and excision of tongue lesion 2000-pt had subsequent radiation  . spleenectomy      Current Outpatient Medications  Medication Sig Dispense Refill  . betamethasone valerate (VALISONE) 0.1 % cream Apply 1 application topically 2 (two) times daily.    . cholecalciferol (VITAMIN D) 1000 UNITS tablet Take 1,000 Units by mouth at bedtime.    . Coenzyme Q10 (CO Q 10 PO) Take 300 mg by mouth daily.    . diclofenac sodium (VOLTAREN) 1 % GEL Apply 1 g topically daily.    Marland Kitchen levothyroxine (SYNTHROID, LEVOTHROID) 137 MCG tablet Take 137 mcg by mouth every other day.    . levothyroxine (SYNTHROID, LEVOTHROID) 150 MCG tablet Take 150 mcg by mouth every other day.    . lisinopril (PRINIVIL,ZESTRIL) 2.5 MG tablet Take 5 mg by mouth at bedtime.     . Multiple Vitamin (MULTIVITAMIN) tablet Take 1 tablet by mouth daily.    . SF 5000 PLUS 1.1 % CREA dental cream Place 1 application onto teeth at bedtime.      Current Facility-Administered Medications  Medication Dose Route Frequency Provider Last Rate Last Dose  . 0.9 %  sodium chloride infusion  500 mL Intravenous Once Milus Banister,  MD        Allergies as of 06/04/2018 - Review Complete 06/03/2018  Allergen Reaction Noted  . Penicillins Hives 09/22/2011    Family History  Problem Relation Age of Onset  . Lung cancer Mother   . Alzheimer's disease Father   . Stroke Father   . Hypertension Brother   . Colon cancer Neg Hx   . Stomach cancer Neg Hx   . Pancreatic cancer Neg Hx   . Esophageal cancer Neg Hx   . Rectal cancer Neg Hx   . Liver disease Neg Hx   . Prostate cancer Neg Hx     Social History   Socioeconomic History  . Marital status: Married    Spouse name: Not on file  . Number of children: 1  . Years of education: Not on file  . Highest education level: Not on  file  Occupational History  . Occupation: Self employedCareers adviser care  Social Needs  . Financial resource strain: Not on file  . Food insecurity:    Worry: Not on file    Inability: Not on file  . Transportation needs:    Medical: Not on file    Non-medical: Not on file  Tobacco Use  . Smoking status: Never Smoker  . Smokeless tobacco: Never Used  Substance and Sexual Activity  . Alcohol use: Yes    Comment: occassionally  . Drug use: No  . Sexual activity: Not on file  Lifestyle  . Physical activity:    Days per week: Not on file    Minutes per session: Not on file  . Stress: Not on file  Relationships  . Social connections:    Talks on phone: Not on file    Gets together: Not on file    Attends religious service: Not on file    Active member of club or organization: Not on file    Attends meetings of clubs or organizations: Not on file    Relationship status: Not on file  . Intimate partner violence:    Fear of current or ex partner: Not on file    Emotionally abused: Not on file    Physically abused: Not on file    Forced sexual activity: Not on file  Other Topics Concern  . Not on file  Social History Narrative  . Not on file     Physical Exam: Unable to perform because this was a "telemed visit" due to current Covid-19 pandemic  Assessment and plan: 63 y.o. male with choking and dysphasia  I am not sure if this is an esophageal problem or if this is related to his previous radiation for base of tongue cancer 20 years ago.  Also he has a very dry mouth and that can contribute certainly to swallowing difficulties.  I recommended a barium esophagram and also a modified barium speech therapy evaluation of his swallowing.  Pending these tests if a stricture in his esophagus is found I would likely proceed with an EGD, dilation.  Please see the "Patient Instructions" section for addition details about the plan.  Keith Loffler, MD [facility] 06/04/2018,  2:24 PM

## 2018-06-04 NOTE — Patient Instructions (Addendum)
We will arrange a barium esophagram and also a modified barium swallow study for his dysphagia, choking, history of radiation to his throat and neck for base of tongue cancer in 2000.  You have been scheduled for a Barium Esophogram at [place] on 06/11/18 at 1030am. Please arrive 15 minutes prior to your appointment for registration. Make certain not to have anything to eat or drink 3 hours prior to your test. If you need to reschedule for any reason, please contact radiology at [phone] to do so. __________________________________________________________________ A barium swallow is an examination that concentrates on views of the esophagus. This tends to be a double contrast exam (barium and two liquids which, when combined, create a gas to distend the wall of the oesophagus) or single contrast (non-ionic iodine based). The study is usually tailored to your symptoms so a good history is essential. Attention is paid during the study to the form, structure and configuration of the esophagus, looking for functional disorders (such as aspiration, dysphagia, achalasia, motility and reflux) EXAMINATION You may be asked to change into a gown, depending on the type of swallow being performed. A radiologist and radiographer will perform the procedure. The radiologist will advise you of the type of contrast selected for your procedure and direct you during the exam. You will be asked to stand, sit or lie in several different positions and to hold a small amount of fluid in your mouth before being asked to swallow while the imaging is performed .In some instances you may be asked to swallow barium coated marshmallows to assess the motility of a solid food bolus. The exam can be recorded as a digital or video fluoroscopy procedure. POST PROCEDURE It will take 1-2 days for the barium to pass through your system. To facilitate this, it is important, unless otherwise directed,  to increase your fluids for the next 24-48hrs and to resume your normal diet.  This test typically takes about 30 minutes to perform.  You have been scheduled for a modified barium swallow on 06/12/18 at 11am. Please arrive 15 minutes prior to your test for registration. You will go to [facility] (1st Floor) for your appointment. Should you need to cancel or reschedule your appointment, please contact [phone] [facility]) or [phone] [place]). _____________________________________________________________________ A Modified Barium Swallow Study, or MBS, is a special x-ray that is taken to check swallowing skills. It is carried out by a Stage manager and a Psychologist, clinical (SLP). During this test, yourmouth, throat, and esophagus, a muscular tube which connects your mouth to your stomach, is checked. The test will help you, your doctor, and the SLP plan what types of foods and liquids are easier for you to swallow. The SLP will also identify positions and ways to help you swallow more easily and safely. What will happen during an MBS? You will be taken to an x-ray room and seated comfortably. You will be asked to swallow small amounts of food and liquid mixed with barium. Barium is a liquid or paste that allows images of your mouth, throat and esophagus to be seen on x-ray. The x-ray captures moving images of the food you are swallowing as it travels from your mouth through your throat and into your esophagus. This test helps identify whether food or liquid is entering your lungs (aspiration). The test also shows which part of your mouth or throat lacks strength or coordination to move the food or liquid in the right  direction. This test typically takes 30 minutes to 1 hour to complete. _______________________________________________________________________  __________________________________________________________________________________

## 2018-06-11 ENCOUNTER — Other Ambulatory Visit: Payer: Self-pay

## 2018-06-11 ENCOUNTER — Ambulatory Visit
Admission: RE | Admit: 2018-06-11 | Discharge: 2018-06-11 | Disposition: A | Discharge status: Home / Self Care | Payer: Medicare HMO | Source: Ambulatory Visit | Attending: Gastroenterology | Admitting: Gastroenterology

## 2018-06-11 DIAGNOSIS — R1310 Dysphagia, unspecified: Secondary | ICD-10-CM | POA: Diagnosis not present

## 2018-06-11 DIAGNOSIS — Z85810 Personal history of malignant neoplasm of tongue: Secondary | ICD-10-CM

## 2018-06-12 ENCOUNTER — Ambulatory Visit (HOSPITAL_COMMUNITY)
Admission: RE | Admit: 2018-06-12 | Discharge: 2018-06-12 | Disposition: A | Discharge status: Home / Self Care | Payer: Medicare HMO | Source: Ambulatory Visit | Attending: Gastroenterology | Admitting: Gastroenterology

## 2018-06-12 DIAGNOSIS — R05 Cough: Secondary | ICD-10-CM | POA: Diagnosis not present

## 2018-06-12 DIAGNOSIS — R1310 Dysphagia, unspecified: Secondary | ICD-10-CM

## 2018-06-12 DIAGNOSIS — Z85810 Personal history of malignant neoplasm of tongue: Secondary | ICD-10-CM

## 2018-06-12 DIAGNOSIS — R1313 Dysphagia, pharyngeal phase: Secondary | ICD-10-CM | POA: Insufficient documentation

## 2018-06-12 NOTE — Consult Note (Signed)
Modified Barium Swallow Progress Note  Patient Details  Name: Keith Thompson MRN: [id number] Date of Birth: May 14, 1955  Today's Date: 06/12/2018  Modified Barium Swallow completed.  Full report located under Chart Review in the Imaging Section.  Brief recommendations include the following:  Clinical Impression Pt presents with adequate oral phase swallow.   Pharyngeal swallow is characterized by timely trigger of the swallow reflex across consistencies with adequate laryngeal elevation. Penetration of thin and nectar thick liquids was seen during the swallow, with trace vallecular and pyriform sinus residue noted. No aspiration was noted on thin or nectar thick liquids. Chin tuck position was noted to increase amount of penetration, increasing aspiration risk significantly. Puree and solid consistencies did not exhibit penetration, however, vallecular and pyriform sinus residue was more significant on these consistencies. Pt was noted to swallow multiple times on all consistencies, and clear throat throughout the examination, indicating intact sensation. Barium tablet was noted given, due to significance of post-swallow residue with solids. Recommend crushed meds if pt has difficulty with whole pills.   Regular diet and thin liquids are recommended, with strategies for minimizing aspiration risk given in written form.  Pt reports his swallow dysfunction has increased over the past year. What began as occasional difficulty with meats, has increased to daily occurrence on both liquids and solids. Pt dysphagia is likely the cumulative effect of radiation treatment from 2000, with slow but gradual decline in muscular function and consequently, swallow safety. SLP reviewed test results with pt, showing his penetration and post-swallow residue during the study. Written information was provided regarding late effects of radiation, xerostomia, and strengthening exercises, as well as safe swallow precautions  to implement at this time.   Pt would benefit from referral to outpatient speech therapy for education regarding home exercise program, and monitoring of diet tolerance and potential need for further assessment or diet change.   Swallow Evaluation Recommendations SLP Diet Recommendations: Regular solids;Thin liquid   Liquid Administration via: Cup;Straw   Medication Administration: Crushed with puree   Supervision: Patient able to self feed   Compensations: Minimize environmental distractions;Slow rate;Small sips/bites;Follow solids with liquid;Clear throat intermittently;Effortful swallow   Postural Changes: Seated upright at 90 degrees   Oral Care Recommendations: Oral care BID   Keith Thompson, [facility], Man Speech Language Pathologist [phone]  Shonna Chock 06/12/2018,1:37 PM

## 2018-06-13 ENCOUNTER — Other Ambulatory Visit: Payer: Self-pay | Admitting: Gastroenterology

## 2018-06-13 DIAGNOSIS — R1310 Dysphagia, unspecified: Secondary | ICD-10-CM

## 2018-06-16 HISTORY — PX: ESOPHAGEAL DILATION: SHX303

## 2018-06-23 ENCOUNTER — Telehealth: Payer: Self-pay | Admitting: *Deleted

## 2018-06-23 NOTE — Telephone Encounter (Signed)

## 2018-06-24 ENCOUNTER — Ambulatory Visit (AMBULATORY_SURGERY_CENTER): Payer: Medicare HMO | Admitting: Gastroenterology

## 2018-06-24 ENCOUNTER — Encounter: Payer: Self-pay | Admitting: Gastroenterology

## 2018-06-24 ENCOUNTER — Other Ambulatory Visit: Payer: Self-pay

## 2018-06-24 VITALS — BP 114/73 | HR 60 | Temp 97.5°F | Resp 20 | Ht 69.0 in | Wt 195.0 lb

## 2018-06-24 DIAGNOSIS — K2970 Gastritis, unspecified, without bleeding: Secondary | ICD-10-CM | POA: Diagnosis not present

## 2018-06-24 DIAGNOSIS — K2270 Barrett's esophagus without dysplasia: Secondary | ICD-10-CM

## 2018-06-24 DIAGNOSIS — K222 Esophageal obstruction: Secondary | ICD-10-CM

## 2018-06-24 DIAGNOSIS — R1310 Dysphagia, unspecified: Secondary | ICD-10-CM

## 2018-06-24 MED ORDER — OMEPRAZOLE 40 MG PO CPDR: 40.0000 mg | DELAYED_RELEASE_CAPSULE | Freq: Every day | ORAL | 11 refills | Status: DC

## 2018-06-24 MED ORDER — SODIUM CHLORIDE 0.9 % IV SOLN: 500.0000 mL | Freq: Once | INTRAVENOUS | Status: DC

## 2018-06-24 NOTE — Progress Notes (Signed)
To PACU, VSS. Report to [email or web address] 

## 2018-06-24 NOTE — Progress Notes (Signed)
Pt's states no medical or surgical changes since previsit or office visit. 

## 2018-06-24 NOTE — Op Note (Signed)
[facility] Patient Name: Keith Thompson Procedure Date: 06/24/2018 8:52 AM MRN: [id number] Endoscopist: Milus Banister , MD Age: 63 Referring MD:  Date of Birth: 1955-03-30 Gender: Male Account #: [id number] Procedure:                Upper GI endoscopy Indications:              Dysphagia; base of tongue cancer 2000, treated with                            XRT; now with mixed dysphagia, choking (MBSS shows                            oropharyngeal weakness, BEsophagram shows proximal                            esophagus web). Medicines:                Monitored Anesthesia Care Procedure:                Pre-Anesthesia Assessment:                           - Prior to the procedure, a History and Physical                            was performed, and patient medications and                            allergies were reviewed. The patient's tolerance of                            previous anesthesia was also reviewed. The risks                            and benefits of the procedure and the sedation                            options and risks were discussed with the patient.                            All questions were answered, and informed consent                            was obtained. Prior Anticoagulants: The patient has                            taken no previous anticoagulant or antiplatelet                            agents. ASA Grade Assessment: II - A patient with                            mild systemic disease. After reviewing the risks  and benefits, the patient was deemed in                            satisfactory condition to undergo the procedure.                           After obtaining informed consent, the endoscope was                            passed under direct vision. Throughout the                            procedure, the patient's blood pressure, pulse, and                            oxygen saturations were monitored  continuously. The                            Endoscope was introduced through the mouth, and                            advanced to the second part of duodenum. The upper                            GI endoscopy was accomplished without difficulty.                            The patient tolerated the procedure well. Scope In: Scope Out: Findings:                 There were mucosal changes consistent with                            Barrett's C3-M4 per Prague criteria present in the                            distal esophagus. There were no associated nodules                            however there was a small erosion at the most                            proximal aspect of the Barrett's change. Mucosa was                            biopsied with a cold forceps for histology in 4                            quadrants at intervals of 2 cm in the lower third                            of the esophagus. A total of 3 specimen bottles  were sent to pathology (GE junction at 38cm, 36cm                            and 34cm). The erosion described above was biopsied                            in the bottle marked 34cm.                           One benign-appearing, intrinsic mild stenosis (thin                            mucosal web) was found 17 cm from the incisors. A                            TTS dilator was passed through the scope. Dilation                            with an 11-27-11 mm balloon and a 16-17-18 mm                            balloon dilator was performed to 17 mm. There was                            the usual superficial mucosal tear and self limited                            bleeding following dilation.                           The exam was otherwise without abnormality. Complications:            No immediate complications. Estimated blood loss:                            None. Estimated Blood Loss:     Estimated blood loss: none. Impression:                - Non-nodular C3M4 Barrett's appearing changes,                            with an single benign appearing erosion at the                            proximal aspect of the Barrett's changes. 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 Dilated with TTS balloon.                           -  The examination was otherwise normal. Recommendation:           - Patient has a contact number available for                            emergencies. The signs and symptoms of potential                            delayed complications were discussed with the                            patient. Return to normal activities tomorrow.                            Written discharge instructions were provided to the                            patient.                           - Resume previous diet.                           - Continue present medications. New prescription                            today for omeprazole 40mg  pills, take one pill once                            daily, disp 30 with 11 refills (to treat your                            underlying erosive acid reflux).                           - Await pathology results. Repeat EGD for Barrett's                            surveillance in 3-4 years pending path review. Milus Banister, MD 06/24/2018 9:27:33 AM This report has been signed electronically.

## 2018-06-24 NOTE — Patient Instructions (Signed)
Information on Barrett's esophagus and Esophagitis given to you today.  Await pathology result.  Start new medication, Omeprazole 40 mg one time each day.  RX sent to pharmacy.  YOU HAD AN ENDOSCOPIC PROCEDURE TODAY AT [facility]:   Refer to the procedure report that was given to you for any specific questions about what was found during the examination.  If the procedure report does not answer your questions, please call your gastroenterologist to clarify.  If you requested that your care partner not be given the details of your procedure findings, then the procedure report has been included in a sealed envelope for you to review at your convenience later.  YOU SHOULD EXPECT: Some feelings of bloating in the abdomen. Passage of more gas than usual.  Walking can help get rid of the air that was put into your GI tract during the procedure and reduce the bloating. If you had a lower endoscopy (such as a colonoscopy or flexible sigmoidoscopy) you may notice spotting of blood in your stool or on the toilet paper. If you underwent a bowel prep for your procedure, you may not have a normal bowel movement for a few days.  Please Note:  You might notice some irritation and congestion in your nose or some drainage.  This is from the oxygen used during your procedure.  There is no need for concern and it should clear up in a day or so.  SYMPTOMS TO REPORT IMMEDIATELY:    Following upper endoscopy (EGD)  Vomiting of blood or coffee ground material  New chest pain or pain under the shoulder blades  Painful or persistently difficult swallowing  New shortness of breath  Fever of 100F or higher  Black, tarry-looking stools  For urgent or emergent issues, a gastroenterologist can be reached at any hour by calling [phone].   DIET:  We do recommend a small meal at first, but then you may proceed to your regular diet.  Drink plenty of fluids but you should avoid alcoholic beverages  for 24 hours.  ACTIVITY:  You should plan to take it easy for the rest of today and you should NOT DRIVE or use heavy machinery until tomorrow (because of the sedation medicines used during the test).    FOLLOW UP: Our staff will call the number listed on your records 48-72 hours following your procedure to check on you and address any questions or concerns that you may have regarding the information given to you following your procedure. If we do not reach you, we will leave a message.  We will attempt to reach you two times.  During this call, we will ask if you have developed any symptoms of COVID 19. If you develop any symptoms (ie: fever, flu-like symptoms, shortness of breath, cough etc.) before then, please call [phone].  If you test positive for Covid 19 in the 2 weeks post procedure, please call and report this information to [place].    If any biopsies were taken you will be contacted by phone or by letter within the next 1-3 weeks.  Please call us at [phone] if you have not heard about the biopsies in 3 weeks.    SIGNATURES/CONFIDENTIALITY: You and/or your care partner have signed paperwork which will be entered into your electronic medical record.  These signatures attest to the fact that that the information above on your After Visit Summary has been reviewed and is understood.  Full responsibility of the confidentiality of  this discharge information lies with you and/or your care-partner.

## 2018-06-24 NOTE — Progress Notes (Signed)
Called to room to assist during endoscopic procedure.  Patient ID and intended procedure confirmed with present staff. Received instructions for my participation in the procedure from the performing physician.  

## 2018-06-26 ENCOUNTER — Telehealth: Payer: Self-pay | Admitting: *Deleted

## 2018-06-26 NOTE — Telephone Encounter (Signed)
  Follow up Call-  Call back number 06/24/2018 02/05/2017  Post procedure Call Back phone  # [phone] cell  Permission to leave phone message Yes Yes  Some recent data might be hidden     Patient questions:  Do you have a fever, pain , or abdominal swelling? No. Pain Score  0 *  Have you tolerated food without any problems? Yes.    Have you been able to return to your normal activities? Yes.    Do you have any questions about your discharge instructions: Diet   No. Medications  No. Follow up visit  No.  Do you have questions or concerns about your Care? No.  Actions: * If pain score is 4 or above: No action needed, pain <4.  1. Have you developed a fever since your procedure? no  2.   Have you had an respiratory symptoms (SOB or cough) since your procedure? no  3.   Have you tested positive for COVID 19 since your procedure no  4.   Have you had any family members/close contacts diagnosed with the COVID 19 since your procedure?  no   If yes to any of these questions please route to Joylene Detravion, RN and Alphonsa Gin, Therapist, sports.   Pt states that he does not remember talking to the dr.  I explained to him that he most likely spoke to him but he probably doesn't remember.  I reviewed his report with him and answered any questions.

## 2018-07-02 ENCOUNTER — Encounter: Payer: Self-pay | Admitting: Gastroenterology

## 2018-07-22 DIAGNOSIS — E039 Hypothyroidism, unspecified: Secondary | ICD-10-CM | POA: Diagnosis not present

## 2018-07-22 DIAGNOSIS — Z8249 Family history of ischemic heart disease and other diseases of the circulatory system: Secondary | ICD-10-CM | POA: Diagnosis not present

## 2018-07-22 DIAGNOSIS — L309 Dermatitis, unspecified: Secondary | ICD-10-CM | POA: Diagnosis not present

## 2018-07-22 DIAGNOSIS — Z85810 Personal history of malignant neoplasm of tongue: Secondary | ICD-10-CM | POA: Diagnosis not present

## 2018-07-22 DIAGNOSIS — I10 Essential (primary) hypertension: Secondary | ICD-10-CM | POA: Diagnosis not present

## 2018-07-22 DIAGNOSIS — R682 Dry mouth, unspecified: Secondary | ICD-10-CM | POA: Diagnosis not present

## 2018-07-22 DIAGNOSIS — Z809 Family history of malignant neoplasm, unspecified: Secondary | ICD-10-CM | POA: Diagnosis not present

## 2018-07-22 DIAGNOSIS — Z79899 Other long term (current) drug therapy: Secondary | ICD-10-CM | POA: Diagnosis not present

## 2018-07-22 DIAGNOSIS — K219 Gastro-esophageal reflux disease without esophagitis: Secondary | ICD-10-CM | POA: Diagnosis not present

## 2018-07-22 DIAGNOSIS — Z820 Family history of epilepsy and other diseases of the nervous system: Secondary | ICD-10-CM | POA: Diagnosis not present

## 2018-08-18 DIAGNOSIS — E039 Hypothyroidism, unspecified: Secondary | ICD-10-CM | POA: Diagnosis not present

## 2018-08-18 DIAGNOSIS — R42 Dizziness and giddiness: Secondary | ICD-10-CM | POA: Diagnosis not present

## 2018-08-18 DIAGNOSIS — E559 Vitamin D deficiency, unspecified: Secondary | ICD-10-CM | POA: Diagnosis not present

## 2018-08-18 DIAGNOSIS — I10 Essential (primary) hypertension: Secondary | ICD-10-CM | POA: Diagnosis not present

## 2018-08-18 DIAGNOSIS — E782 Mixed hyperlipidemia: Secondary | ICD-10-CM | POA: Diagnosis not present

## 2018-08-21 DIAGNOSIS — E559 Vitamin D deficiency, unspecified: Secondary | ICD-10-CM | POA: Diagnosis not present

## 2018-08-21 DIAGNOSIS — E039 Hypothyroidism, unspecified: Secondary | ICD-10-CM | POA: Diagnosis not present

## 2018-08-21 DIAGNOSIS — I10 Essential (primary) hypertension: Secondary | ICD-10-CM | POA: Diagnosis not present

## 2018-08-21 DIAGNOSIS — E782 Mixed hyperlipidemia: Secondary | ICD-10-CM | POA: Diagnosis not present

## 2018-09-02 ENCOUNTER — Telehealth: Payer: Self-pay | Admitting: Gastroenterology

## 2018-09-02 NOTE — Telephone Encounter (Signed)
Dr Jacobs FYI 

## 2018-09-02 NOTE — Telephone Encounter (Signed)
Pt informed that he is 99% better post EGD.  FYI.

## 2018-10-01 DIAGNOSIS — Z23 Encounter for immunization: Secondary | ICD-10-CM | POA: Diagnosis not present

## 2018-10-24 DIAGNOSIS — Z6830 Body mass index (BMI) 30.0-30.9, adult: Secondary | ICD-10-CM | POA: Diagnosis not present

## 2018-10-24 DIAGNOSIS — K219 Gastro-esophageal reflux disease without esophagitis: Secondary | ICD-10-CM | POA: Diagnosis not present

## 2018-10-24 DIAGNOSIS — I10 Essential (primary) hypertension: Secondary | ICD-10-CM | POA: Diagnosis not present

## 2018-10-24 DIAGNOSIS — E782 Mixed hyperlipidemia: Secondary | ICD-10-CM | POA: Diagnosis not present

## 2018-12-19 DIAGNOSIS — Z125 Encounter for screening for malignant neoplasm of prostate: Secondary | ICD-10-CM | POA: Diagnosis not present

## 2018-12-19 DIAGNOSIS — Z0184 Encounter for antibody response examination: Secondary | ICD-10-CM | POA: Diagnosis not present

## 2018-12-19 DIAGNOSIS — Z0000 Encounter for general adult medical examination without abnormal findings: Secondary | ICD-10-CM | POA: Diagnosis not present

## 2018-12-20 LAB — PSA: PSA: 1.6

## 2018-12-24 DIAGNOSIS — E559 Vitamin D deficiency, unspecified: Secondary | ICD-10-CM | POA: Diagnosis not present

## 2018-12-24 DIAGNOSIS — E782 Mixed hyperlipidemia: Secondary | ICD-10-CM | POA: Diagnosis not present

## 2018-12-24 DIAGNOSIS — I10 Essential (primary) hypertension: Secondary | ICD-10-CM | POA: Diagnosis not present

## 2018-12-24 DIAGNOSIS — E039 Hypothyroidism, unspecified: Secondary | ICD-10-CM | POA: Diagnosis not present

## 2019-02-03 DIAGNOSIS — E559 Vitamin D deficiency, unspecified: Secondary | ICD-10-CM | POA: Diagnosis not present

## 2019-02-03 DIAGNOSIS — E039 Hypothyroidism, unspecified: Secondary | ICD-10-CM | POA: Diagnosis not present

## 2019-02-03 DIAGNOSIS — E782 Mixed hyperlipidemia: Secondary | ICD-10-CM | POA: Diagnosis not present

## 2019-02-06 DIAGNOSIS — E782 Mixed hyperlipidemia: Secondary | ICD-10-CM | POA: Diagnosis not present

## 2019-02-06 DIAGNOSIS — E039 Hypothyroidism, unspecified: Secondary | ICD-10-CM | POA: Diagnosis not present

## 2019-02-06 DIAGNOSIS — I10 Essential (primary) hypertension: Secondary | ICD-10-CM | POA: Diagnosis not present

## 2019-02-06 DIAGNOSIS — Z6829 Body mass index (BMI) 29.0-29.9, adult: Secondary | ICD-10-CM | POA: Diagnosis not present

## 2019-02-06 DIAGNOSIS — E559 Vitamin D deficiency, unspecified: Secondary | ICD-10-CM | POA: Diagnosis not present

## 2019-05-05 DIAGNOSIS — E782 Mixed hyperlipidemia: Secondary | ICD-10-CM | POA: Diagnosis not present

## 2019-05-05 DIAGNOSIS — E559 Vitamin D deficiency, unspecified: Secondary | ICD-10-CM | POA: Diagnosis not present

## 2019-05-05 DIAGNOSIS — E039 Hypothyroidism, unspecified: Secondary | ICD-10-CM | POA: Diagnosis not present

## 2019-05-06 LAB — BASIC METABOLIC PANEL
BUN: 10 (ref 4–21)
CO2: 26 — AB (ref 13–22)
Chloride: 103 (ref 99–108)
Creatinine: 0.9 (ref 0.6–1.3)
Glucose: 96
Potassium: 4.4 (ref 3.4–5.3)
Sodium: 141 (ref 137–147)

## 2019-05-06 LAB — HEPATIC FUNCTION PANEL
ALT: 21 (ref 10–40)
AST: 23 (ref 14–40)
Alkaline Phosphatase: 63 (ref 25–125)
Bilirubin, Total: 0.8

## 2019-05-06 LAB — LIPID PANEL
Cholesterol: 154 (ref 0–200)
HDL: 54 (ref 35–70)
LDL Cholesterol: 85
Triglycerides: 80 (ref 40–160)

## 2019-05-06 LAB — TSH: TSH: 3.17 (ref 0.41–5.90)

## 2019-05-06 LAB — COMPREHENSIVE METABOLIC PANEL
Albumin: 4.6 (ref 3.5–5.0)
Calcium: 9.7 (ref 8.7–10.7)
GFR calc Af Amer: 105
GFR calc non Af Amer: 91
Globulin: 2.4

## 2019-05-06 LAB — VITAMIN D 25 HYDROXY (VIT D DEFICIENCY, FRACTURES): Vit D, 25-Hydroxy: 44.3

## 2019-05-08 DIAGNOSIS — E559 Vitamin D deficiency, unspecified: Secondary | ICD-10-CM | POA: Diagnosis not present

## 2019-05-08 DIAGNOSIS — E039 Hypothyroidism, unspecified: Secondary | ICD-10-CM | POA: Diagnosis not present

## 2019-05-08 DIAGNOSIS — E782 Mixed hyperlipidemia: Secondary | ICD-10-CM | POA: Diagnosis not present

## 2019-05-08 DIAGNOSIS — I10 Essential (primary) hypertension: Secondary | ICD-10-CM | POA: Diagnosis not present

## 2019-06-14 DIAGNOSIS — Z008 Encounter for other general examination: Secondary | ICD-10-CM | POA: Diagnosis not present

## 2019-06-14 DIAGNOSIS — E039 Hypothyroidism, unspecified: Secondary | ICD-10-CM | POA: Diagnosis not present

## 2019-06-14 DIAGNOSIS — Z85810 Personal history of malignant neoplasm of tongue: Secondary | ICD-10-CM | POA: Diagnosis not present

## 2019-06-14 DIAGNOSIS — Z825 Family history of asthma and other chronic lower respiratory diseases: Secondary | ICD-10-CM | POA: Diagnosis not present

## 2019-06-14 DIAGNOSIS — L409 Psoriasis, unspecified: Secondary | ICD-10-CM | POA: Diagnosis not present

## 2019-06-14 DIAGNOSIS — K219 Gastro-esophageal reflux disease without esophagitis: Secondary | ICD-10-CM | POA: Diagnosis not present

## 2019-06-14 DIAGNOSIS — H547 Unspecified visual loss: Secondary | ICD-10-CM | POA: Diagnosis not present

## 2019-06-14 DIAGNOSIS — I10 Essential (primary) hypertension: Secondary | ICD-10-CM | POA: Diagnosis not present

## 2019-06-14 DIAGNOSIS — E785 Hyperlipidemia, unspecified: Secondary | ICD-10-CM | POA: Diagnosis not present

## 2019-06-14 DIAGNOSIS — Z809 Family history of malignant neoplasm, unspecified: Secondary | ICD-10-CM | POA: Diagnosis not present

## 2019-06-14 DIAGNOSIS — Z823 Family history of stroke: Secondary | ICD-10-CM | POA: Diagnosis not present

## 2019-07-17 DIAGNOSIS — Z1331 Encounter for screening for depression: Secondary | ICD-10-CM | POA: Diagnosis not present

## 2019-07-17 DIAGNOSIS — Z2821 Immunization not carried out because of patient refusal: Secondary | ICD-10-CM | POA: Diagnosis not present

## 2019-07-17 DIAGNOSIS — Z0000 Encounter for general adult medical examination without abnormal findings: Secondary | ICD-10-CM | POA: Diagnosis not present

## 2019-07-17 DIAGNOSIS — Z1339 Encounter for screening examination for other mental health and behavioral disorders: Secondary | ICD-10-CM | POA: Diagnosis not present

## 2019-07-17 DIAGNOSIS — I10 Essential (primary) hypertension: Secondary | ICD-10-CM | POA: Diagnosis not present

## 2019-10-30 DIAGNOSIS — E782 Mixed hyperlipidemia: Secondary | ICD-10-CM | POA: Diagnosis not present

## 2019-10-30 DIAGNOSIS — E559 Vitamin D deficiency, unspecified: Secondary | ICD-10-CM | POA: Diagnosis not present

## 2019-10-30 DIAGNOSIS — I10 Essential (primary) hypertension: Secondary | ICD-10-CM | POA: Diagnosis not present

## 2019-10-30 DIAGNOSIS — R69 Illness, unspecified: Secondary | ICD-10-CM | POA: Diagnosis not present

## 2019-10-30 DIAGNOSIS — E039 Hypothyroidism, unspecified: Secondary | ICD-10-CM | POA: Diagnosis not present

## 2019-11-05 DIAGNOSIS — K219 Gastro-esophageal reflux disease without esophagitis: Secondary | ICD-10-CM | POA: Diagnosis not present

## 2019-11-05 DIAGNOSIS — E559 Vitamin D deficiency, unspecified: Secondary | ICD-10-CM | POA: Diagnosis not present

## 2019-11-05 DIAGNOSIS — I10 Essential (primary) hypertension: Secondary | ICD-10-CM | POA: Diagnosis not present

## 2019-11-05 DIAGNOSIS — E039 Hypothyroidism, unspecified: Secondary | ICD-10-CM | POA: Diagnosis not present

## 2019-11-05 DIAGNOSIS — Z2821 Immunization not carried out because of patient refusal: Secondary | ICD-10-CM | POA: Diagnosis not present

## 2019-11-05 DIAGNOSIS — E782 Mixed hyperlipidemia: Secondary | ICD-10-CM | POA: Diagnosis not present

## 2019-12-29 DIAGNOSIS — E039 Hypothyroidism, unspecified: Secondary | ICD-10-CM | POA: Diagnosis not present

## 2020-01-04 DIAGNOSIS — E039 Hypothyroidism, unspecified: Secondary | ICD-10-CM | POA: Diagnosis not present

## 2020-01-04 DIAGNOSIS — Z2821 Immunization not carried out because of patient refusal: Secondary | ICD-10-CM | POA: Diagnosis not present

## 2020-01-04 DIAGNOSIS — I10 Essential (primary) hypertension: Secondary | ICD-10-CM | POA: Diagnosis not present

## 2020-01-04 DIAGNOSIS — R634 Abnormal weight loss: Secondary | ICD-10-CM | POA: Diagnosis not present

## 2020-01-04 DIAGNOSIS — Z7189 Other specified counseling: Secondary | ICD-10-CM | POA: Diagnosis not present

## 2020-01-20 DIAGNOSIS — H5211 Myopia, right eye: Secondary | ICD-10-CM | POA: Diagnosis not present

## 2020-02-25 DIAGNOSIS — H2511 Age-related nuclear cataract, right eye: Secondary | ICD-10-CM | POA: Diagnosis not present

## 2020-02-25 DIAGNOSIS — H25043 Posterior subcapsular polar age-related cataract, bilateral: Secondary | ICD-10-CM | POA: Diagnosis not present

## 2020-02-25 DIAGNOSIS — H18413 Arcus senilis, bilateral: Secondary | ICD-10-CM | POA: Diagnosis not present

## 2020-02-25 DIAGNOSIS — H2513 Age-related nuclear cataract, bilateral: Secondary | ICD-10-CM | POA: Diagnosis not present

## 2020-02-25 DIAGNOSIS — H40013 Open angle with borderline findings, low risk, bilateral: Secondary | ICD-10-CM | POA: Diagnosis not present

## 2020-03-01 ENCOUNTER — Other Ambulatory Visit: Payer: Self-pay

## 2020-03-01 ENCOUNTER — Emergency Department (HOSPITAL_COMMUNITY)
Admission: EM | Admit: 2020-03-01 | Discharge: 2020-03-02 | Disposition: A | Discharge status: Home / Self Care | Payer: Medicare HMO | Attending: Emergency Medicine | Admitting: Emergency Medicine

## 2020-03-01 DIAGNOSIS — Z79899 Other long term (current) drug therapy: Secondary | ICD-10-CM | POA: Insufficient documentation

## 2020-03-01 DIAGNOSIS — Z85810 Personal history of malignant neoplasm of tongue: Secondary | ICD-10-CM | POA: Insufficient documentation

## 2020-03-01 DIAGNOSIS — E039 Hypothyroidism, unspecified: Secondary | ICD-10-CM | POA: Diagnosis not present

## 2020-03-01 DIAGNOSIS — I10 Essential (primary) hypertension: Secondary | ICD-10-CM | POA: Diagnosis not present

## 2020-03-01 DIAGNOSIS — R519 Headache, unspecified: Secondary | ICD-10-CM | POA: Diagnosis not present

## 2020-03-01 LAB — BASIC METABOLIC PANEL
Anion gap: 7 (ref 5–15)
BUN: 10 mg/dL (ref 8–23)
CO2: 28 mmol/L (ref 22–32)
Calcium: 9.2 mg/dL (ref 8.9–10.3)
Chloride: 105 mmol/L (ref 98–111)
Creatinine, Ser: 0.9 mg/dL (ref 0.61–1.24)
GFR, Estimated: >60 mL/min (ref >60)
Glucose, Bld: 133 mg/dL — ABNORMAL HIGH (ref 70–99)
Potassium: 4 mmol/L (ref 3.5–5.1)
Sodium: 140 mmol/L (ref 135–145)

## 2020-03-01 LAB — CBC
HCT: 44.6 % (ref 39.0–52.0)
Hemoglobin: 15.4 g/dL (ref 13.0–17.0)
MCH: 33.3 pg (ref 26.0–34.0)
MCHC: 34.5 g/dL (ref 30.0–36.0)
MCV: 96.3 fL (ref 80.0–100.0)
Platelets: 233 10*3/uL (ref 150–400)
RBC: 4.63 MIL/uL (ref 4.22–5.81)
RDW: 13.4 % (ref 11.5–15.5)
WBC: 7.2 10*3/uL (ref 4.0–10.5)
nRBC: 0 % (ref 0.0–0.2)

## 2020-03-01 MED ORDER — LISINOPRIL 20 MG PO TABS
30.0000 mg | ORAL_TABLET | Freq: Once | ORAL | Status: AC
  Administered 2020-03-01: 30 mg via ORAL
  Filled 2020-03-01: qty 1

## 2020-03-01 NOTE — ED Triage Notes (Signed)
Pt via EMS for eval of headache x 3 days and elevated bp readings at home. Takes 30mg  Lisinopril every night and has not missed any doses. Denies chest pain and no neuro deficits noted.
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
## 2020-03-01 NOTE — ED Notes (Signed)
RN notified about vitals 

## 2020-03-02 NOTE — Discharge Instructions (Addendum)
I have attached a large amount of information on how to take your blood pressure as well as managing hypertension.  Please continue to take your lisinopril daily as prescribed.  Please work to find a new primary care provider in your area.  While you are waiting for your first appointment, please start measuring your blood pressure twice a day.  Please journal when you take your blood pressure as well as your blood pressure reading.  Also make sure you are journaling what time you are taking your medication.  This information will help your new primary doctor immensely.  If you develop any new or worsening symptoms, please return to the emergency department immediately for reevaluation.  It was a pleasure to meet you both.

## 2020-03-02 NOTE — ED Notes (Signed)
Pt d/c by MD and is provided w/ d/c instructions and follow up care, pt is out of ED ambulatory with family

## 2020-03-04 DIAGNOSIS — E039 Hypothyroidism, unspecified: Secondary | ICD-10-CM | POA: Diagnosis not present

## 2020-03-08 DIAGNOSIS — E039 Hypothyroidism, unspecified: Secondary | ICD-10-CM | POA: Diagnosis not present

## 2020-03-08 DIAGNOSIS — I10 Essential (primary) hypertension: Secondary | ICD-10-CM | POA: Diagnosis not present

## 2020-03-08 DIAGNOSIS — J309 Allergic rhinitis, unspecified: Secondary | ICD-10-CM | POA: Diagnosis not present

## 2020-03-15 HISTORY — PX: CATARACT EXTRACTION, BILATERAL: SHX1313

## 2020-03-23 ENCOUNTER — Other Ambulatory Visit: Payer: Self-pay

## 2020-03-23 DIAGNOSIS — H2511 Age-related nuclear cataract, right eye: Secondary | ICD-10-CM | POA: Diagnosis not present

## 2020-03-24 DIAGNOSIS — H2512 Age-related nuclear cataract, left eye: Secondary | ICD-10-CM | POA: Diagnosis not present

## 2020-04-06 DIAGNOSIS — H2512 Age-related nuclear cataract, left eye: Secondary | ICD-10-CM | POA: Diagnosis not present

## 2020-04-07 ENCOUNTER — Telehealth: Payer: Self-pay

## 2020-04-07 NOTE — Telephone Encounter (Signed)
Please assist with scheduling, thanks.  [facility] - Client Nonclinical Telephone Record  [facility] Client [facility] - Client Client Site [place] - Day Physician Raoul Pitch, [place] Type Call Who Is Calling Patient / Member / Family / Caregiver Caller Name Keith Thompson Caller Phone Number [phone] Patient Name Doctor Sheahan Patient DOB 12/26/1955 Call Type Message Only Information Provided Reason for Call Request to Schedule Office Appointment Initial Comment Caller states he has a new patient appointment scheduled. After his cataract surgery yesterday, his blood pressure dropped to 59/46 . He has been up and down . He is wanting a sooner appointment. Disp. Time Disposition Final User 04/07/2020 8:05:03 AM General Information Provided Yes Vinnie Level Call Closed By: Vinnie Level Transaction Date/Time: 04/07/2020 8:00:29 AM (ET)

## 2020-04-11 NOTE — Telephone Encounter (Signed)
Please see [facility] encounter below.  Patient is scheduled for new patient appt on 06/03/20.  Please advise. Thank you.

## 2020-04-11 NOTE — Telephone Encounter (Signed)
Pt was advise to contact current PCP.

## 2020-04-13 DIAGNOSIS — E039 Hypothyroidism, unspecified: Secondary | ICD-10-CM | POA: Diagnosis not present

## 2020-04-18 DIAGNOSIS — E782 Mixed hyperlipidemia: Secondary | ICD-10-CM | POA: Diagnosis not present

## 2020-04-18 DIAGNOSIS — E039 Hypothyroidism, unspecified: Secondary | ICD-10-CM | POA: Diagnosis not present

## 2020-04-18 DIAGNOSIS — E559 Vitamin D deficiency, unspecified: Secondary | ICD-10-CM | POA: Diagnosis not present

## 2020-04-18 DIAGNOSIS — I10 Essential (primary) hypertension: Secondary | ICD-10-CM | POA: Diagnosis not present

## 2020-06-02 ENCOUNTER — Other Ambulatory Visit: Payer: Self-pay

## 2020-06-03 ENCOUNTER — Ambulatory Visit (INDEPENDENT_AMBULATORY_CARE_PROVIDER_SITE_OTHER): Payer: Medicare HMO | Admitting: Family Medicine

## 2020-06-03 ENCOUNTER — Encounter: Payer: Self-pay | Admitting: Family Medicine

## 2020-06-03 VITALS — BP 124/75 | HR 71 | Temp 98.1°F | Ht 68.5 in | Wt 197.0 lb

## 2020-06-03 DIAGNOSIS — Z0000 Encounter for general adult medical examination without abnormal findings: Secondary | ICD-10-CM

## 2020-06-03 DIAGNOSIS — E785 Hyperlipidemia, unspecified: Secondary | ICD-10-CM

## 2020-06-03 DIAGNOSIS — E890 Postprocedural hypothyroidism: Secondary | ICD-10-CM

## 2020-06-03 DIAGNOSIS — J386 Stenosis of larynx: Secondary | ICD-10-CM | POA: Diagnosis not present

## 2020-06-03 DIAGNOSIS — Z85810 Personal history of malignant neoplasm of tongue: Secondary | ICD-10-CM | POA: Diagnosis not present

## 2020-06-03 DIAGNOSIS — I10 Essential (primary) hypertension: Secondary | ICD-10-CM | POA: Diagnosis not present

## 2020-06-03 MED ORDER — DICLOFENAC SODIUM 1 % EX GEL
2.0000 g | Freq: Four times a day (QID) | CUTANEOUS | 3 refills | Status: DC
Start: 2020-06-03 — End: 2020-07-29

## 2020-06-03 MED ORDER — LEVOTHYROXINE SODIUM 125 MCG PO TABS: 125.0000 ug | ORAL_TABLET | Freq: Every day | ORAL | 3 refills | Status: DC

## 2020-06-03 MED ORDER — ROSUVASTATIN CALCIUM 5 MG PO TABS: 5.0000 mg | ORAL_TABLET | Freq: Every day | ORAL | 3 refills | Status: DC

## 2020-06-03 MED ORDER — BETAMETHASONE VALERATE 0.1 % EX OINT: TOPICAL_OINTMENT | Freq: Two times a day (BID) | CUTANEOUS | 5 refills | Status: DC

## 2020-06-03 MED ORDER — OMEPRAZOLE 40 MG PO CPDR: 40.0000 mg | DELAYED_RELEASE_CAPSULE | Freq: Every day | ORAL | 3 refills | Status: DC

## 2020-06-03 MED ORDER — VALSARTAN-HYDROCHLOROTHIAZIDE 160-12.5 MG PO TABS: 1.0000 | ORAL_TABLET | Freq: Every day | ORAL | 1 refills | Status: DC

## 2020-06-03 NOTE — Patient Instructions (Addendum)
  Great to see you today.  I have refilled the medication(s) we provide.   If labs were collected, we will inform you of lab results once received either by echart message or telephone call.   - echart message- for normal results that have been seen by the patient already.   - telephone call: abnormal results or if patient has not viewed results in their echart.  Next appt - physical July 11 week. Fasting labs will be collected
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
## 2020-06-04 LAB — T4, FREE: Free T4: 1.7 ng/dL (ref 0.8–1.8)

## 2020-06-04 LAB — TSH: TSH: 0.61 mIU/L (ref 0.40–4.50)

## 2020-06-04 LAB — T3, FREE: T3, Free: 3.4 pg/mL (ref 2.3–4.2)

## 2020-06-06 ENCOUNTER — Encounter: Payer: Self-pay | Admitting: Family Medicine

## 2020-07-28 ENCOUNTER — Other Ambulatory Visit: Payer: Self-pay

## 2020-07-29 ENCOUNTER — Ambulatory Visit (INDEPENDENT_AMBULATORY_CARE_PROVIDER_SITE_OTHER): Payer: Medicare HMO | Admitting: Family Medicine

## 2020-07-29 ENCOUNTER — Encounter: Payer: Self-pay | Admitting: Family Medicine

## 2020-07-29 VITALS — BP 126/82 | HR 61 | Temp 98.0°F | Ht 69.0 in | Wt 198.0 lb

## 2020-07-29 DIAGNOSIS — Z86010 Personal history of colonic polyps: Secondary | ICD-10-CM | POA: Diagnosis not present

## 2020-07-29 DIAGNOSIS — E890 Postprocedural hypothyroidism: Secondary | ICD-10-CM

## 2020-07-29 DIAGNOSIS — Z125 Encounter for screening for malignant neoplasm of prostate: Secondary | ICD-10-CM | POA: Diagnosis not present

## 2020-07-29 DIAGNOSIS — Z0000 Encounter for general adult medical examination without abnormal findings: Secondary | ICD-10-CM | POA: Diagnosis not present

## 2020-07-29 DIAGNOSIS — J386 Stenosis of larynx: Secondary | ICD-10-CM

## 2020-07-29 DIAGNOSIS — Z1159 Encounter for screening for other viral diseases: Secondary | ICD-10-CM

## 2020-07-29 DIAGNOSIS — I10 Essential (primary) hypertension: Secondary | ICD-10-CM | POA: Diagnosis not present

## 2020-07-29 DIAGNOSIS — Z9081 Acquired absence of spleen: Secondary | ICD-10-CM

## 2020-07-29 DIAGNOSIS — Z131 Encounter for screening for diabetes mellitus: Secondary | ICD-10-CM

## 2020-07-29 DIAGNOSIS — R718 Other abnormality of red blood cells: Secondary | ICD-10-CM

## 2020-07-29 DIAGNOSIS — Z23 Encounter for immunization: Secondary | ICD-10-CM | POA: Diagnosis not present

## 2020-07-29 DIAGNOSIS — E559 Vitamin D deficiency, unspecified: Secondary | ICD-10-CM | POA: Diagnosis not present

## 2020-07-29 LAB — CBC WITH DIFFERENTIAL/PLATELET
Basophils Absolute: 0.1 10*3/uL (ref 0.0–0.1)
Basophils Relative: 1.6 % (ref 0.0–3.0)
Eosinophils Absolute: 0.1 10*3/uL (ref 0.0–0.7)
Eosinophils Relative: 1.6 % (ref 0.0–5.0)
HCT: 47.7 % (ref 39.0–52.0)
Hemoglobin: 16.2 g/dL (ref 13.0–17.0)
Lymphocytes Relative: 36.5 % (ref 12.0–46.0)
Lymphs Abs: 2.4 10*3/uL (ref 0.7–4.0)
MCHC: 34 g/dL (ref 30.0–36.0)
MCV: 98.4 fl (ref 78.0–100.0)
Monocytes Absolute: 1 10*3/uL (ref 0.1–1.0)
Monocytes Relative: 15.2 % — ABNORMAL HIGH (ref 3.0–12.0)
Neutro Abs: 2.9 10*3/uL (ref 1.4–7.7)
Neutrophils Relative %: 45.1 % (ref 43.0–77.0)
Platelets: 220 10*3/uL (ref 150.0–400.0)
RBC: 4.85 Mil/uL (ref 4.22–5.81)
RDW: 13.6 % (ref 11.5–15.5)
WBC: 6.5 10*3/uL (ref 4.0–10.5)

## 2020-07-29 LAB — VITAMIN D 25 HYDROXY (VIT D DEFICIENCY, FRACTURES): VITD: 65.46 ng/mL (ref 30.00–100.00)

## 2020-07-29 LAB — COMPREHENSIVE METABOLIC PANEL
ALT: 19 U/L (ref 0–53)
AST: 22 U/L (ref 0–37)
Albumin: 4.6 g/dL (ref 3.5–5.2)
Alkaline Phosphatase: 64 U/L (ref 39–117)
BUN: 17 mg/dL (ref 6–23)
CO2: 29 mEq/L (ref 19–32)
Calcium: 9.4 mg/dL (ref 8.4–10.5)
Chloride: 103 mEq/L (ref 96–112)
Creatinine, Ser: 0.87 mg/dL (ref 0.40–1.50)
GFR: 89.39 mL/min (ref >60.00)
Glucose, Bld: 91 mg/dL (ref 70–99)
Potassium: 4.7 mEq/L (ref 3.5–5.1)
Sodium: 139 mEq/L (ref 135–145)
Total Bilirubin: 0.9 mg/dL (ref 0.2–1.2)
Total Protein: 7.2 g/dL (ref 6.0–8.3)

## 2020-07-29 LAB — LIPID PANEL
Cholesterol: 172 mg/dL (ref 0–200)
HDL: 61.2 mg/dL (ref >39.00)
LDL Cholesterol: 100 mg/dL — ABNORMAL HIGH (ref 0–99)
NonHDL: 110.81
Total CHOL/HDL Ratio: 3
Triglycerides: 55 mg/dL (ref 0.0–149.0)
VLDL: 11 mg/dL (ref 0.0–40.0)

## 2020-07-29 LAB — PSA: PSA: 1.06 ng/mL (ref 0.10–4.00)

## 2020-07-29 LAB — HEMOGLOBIN A1C: Hgb A1c MFr Bld: 5.9 % (ref 4.6–6.5)

## 2020-07-29 MED ORDER — VALSARTAN 40 MG PO TABS: 40.0000 mg | ORAL_TABLET | Freq: Every day | ORAL | 1 refills | Status: DC

## 2020-07-29 MED ORDER — DICLOFENAC SODIUM 1 % EX GEL: 4.0000 g | Freq: Four times a day (QID) | CUTANEOUS | 3 refills | Status: DC

## 2020-07-29 NOTE — Patient Instructions (Signed)
Decreased valsartan to 40 mg tab. Start 1/2 tab daily for 1 week. Goal 120s/70s, if not at goal take 1 tab daily.  Health Maintenance After Age 67 After age 2, you are at a higher risk for certain long-term diseases and infections as well as injuries from falls. Falls are a major cause of broken bones and head injuries in people who are older than age 39. Getting regular preventive care can help to keep you healthy and well. Preventive care includes getting regular testing and making lifestyle changes as recommended by your health care provider. Talk with your health care provider about: Which screenings and tests you should have. A screening is a test that checks for a disease when you have no symptoms. A diet and exercise plan that is right for you. What should I know about screenings and tests to prevent falls? Screening and testing are the best ways to find a health problem early. Early diagnosis and treatment give you the best chance of managing medical conditions that are common after age 17. Certain conditions and lifestyle choices may make you more likely to have a fall. Your health care provider may recommend: Regular vision checks. Poor vision and conditions such as cataracts can make you more likely to have a fall. If you wear glasses, make sure to get your prescription updated if your vision changes. Medicine review. Work with your health care provider to regularly review all of the medicines you are taking, including over-the-counter medicines. Ask your health care provider about any side effects that may make you more likely to have a fall. Tell your health care provider if any medicines that you take make you feel dizzy or sleepy. Osteoporosis screening. Osteoporosis is a condition that causes the bones to get weaker. This can make the bones weak and cause them to break more easily. Blood pressure screening. Blood pressure changes and medicines to control blood pressure can make you feel  dizzy. Strength and balance checks. Your health care provider may recommend certain tests to check your strength and balance while standing, walking, or changing positions. Foot health exam. Foot pain and numbness, as well as not wearing proper footwear, can make you more likely to have a fall. Depression screening. You may be more likely to have a fall if you have a fear of falling, feel emotionally low, or feel unable to do activities that you used to do. Alcohol use screening. Using too much alcohol can affect your balance and may make you more likely to have a fall. What actions can I take to lower my risk of falls? General instructions Talk with your health care provider about your risks for falling. Tell your health care provider if: You fall. Be sure to tell your health care provider about all falls, even ones that seem minor. You feel dizzy, sleepy, or off-balance. Take over-the-counter and prescription medicines only as told by your health care provider. These include any supplements. Eat a healthy diet and maintain a healthy weight. A healthy diet includes low-fat dairy products, low-fat (lean) meats, and fiber from whole grains, beans, and lots of fruits and vegetables. Home safety Remove any tripping hazards, such as rugs, cords, and clutter. Install safety equipment such as grab bars in bathrooms and safety rails on stairs. Keep rooms and walkways well-lit. Activity  Follow a regular exercise program to stay fit. This will help you maintain your balance. Ask your health care provider what types of exercise are appropriate for you. If you  need a cane or walker, use it as recommended by your health care provider. Wear supportive shoes that have nonskid soles.  Lifestyle Do not drink alcohol if your health care provider tells you not to drink. If you drink alcohol, limit how much you have: 0-1 drink a day for women. 0-2 drinks a day for men. Be aware of how much alcohol is in  your drink. In the [place] one drink equals one typical bottle of beer (12 oz), one-half glass of wine (5 oz), or one shot of hard liquor (1 oz). Do not use any products that contain nicotine or tobacco, such as cigarettes and e-cigarettes. If you need help quitting, ask your health care provider. Summary Having a healthy lifestyle and getting preventive care can help to protect your health and wellness after age 71. Screening and testing are the best way to find a health problem early and help you avoid having a fall. Early diagnosis and treatment give you the best chance for managing medical conditions that are more common for people who are older than age 38. Falls are a major cause of broken bones and head injuries in people who are older than age 68. Take precautions to prevent a fall at home. Work with your health care provider to learn what changes you can make to improve your health and wellness and to prevent falls. This information is not intended to replace advice given to you by your health care provider. Make sure you discuss any questions you have with your healthcare provider. Document Revised: 12/18/2019 Document Reviewed: 12/18/2019 Elsevier Patient Education  2022 [employer].
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
## 2020-08-01 LAB — HEPATITIS C ANTIBODY
Hepatitis C Ab: NONREACTIVE
SIGNAL TO CUT-OFF: 0.03 (ref <1.00)

## 2020-10-03 ENCOUNTER — Ambulatory Visit: Payer: Medicare HMO

## 2020-10-06 ENCOUNTER — Ambulatory Visit (INDEPENDENT_AMBULATORY_CARE_PROVIDER_SITE_OTHER): Payer: Medicare HMO

## 2020-10-06 ENCOUNTER — Other Ambulatory Visit: Payer: Self-pay

## 2020-10-06 DIAGNOSIS — Z23 Encounter for immunization: Secondary | ICD-10-CM

## 2020-11-22 ENCOUNTER — Other Ambulatory Visit: Payer: Self-pay | Admitting: Family Medicine

## 2020-12-26 ENCOUNTER — Other Ambulatory Visit: Payer: Self-pay

## 2020-12-26 ENCOUNTER — Emergency Department (HOSPITAL_COMMUNITY)
Admission: EM | Admit: 2020-12-26 | Discharge: 2020-12-26 | Disposition: A | Discharge status: Home / Self Care | Payer: Medicare HMO | Attending: Emergency Medicine | Admitting: Emergency Medicine

## 2020-12-26 ENCOUNTER — Encounter (HOSPITAL_COMMUNITY): Payer: Self-pay

## 2020-12-26 ENCOUNTER — Emergency Department (HOSPITAL_COMMUNITY): Payer: Medicare HMO

## 2020-12-26 DIAGNOSIS — W01198A Fall on same level from slipping, tripping and stumbling with subsequent striking against other object, initial encounter: Secondary | ICD-10-CM | POA: Insufficient documentation

## 2020-12-26 DIAGNOSIS — Z79899 Other long term (current) drug therapy: Secondary | ICD-10-CM | POA: Diagnosis not present

## 2020-12-26 DIAGNOSIS — S3992XA Unspecified injury of lower back, initial encounter: Secondary | ICD-10-CM

## 2020-12-26 DIAGNOSIS — E039 Hypothyroidism, unspecified: Secondary | ICD-10-CM | POA: Insufficient documentation

## 2020-12-26 DIAGNOSIS — I10 Essential (primary) hypertension: Secondary | ICD-10-CM | POA: Insufficient documentation

## 2020-12-26 DIAGNOSIS — Z85819 Personal history of malignant neoplasm of unspecified site of lip, oral cavity, and pharynx: Secondary | ICD-10-CM | POA: Diagnosis not present

## 2020-12-26 DIAGNOSIS — S299XXA Unspecified injury of thorax, initial encounter: Secondary | ICD-10-CM | POA: Diagnosis not present

## 2020-12-26 MED ORDER — METHOCARBAMOL 500 MG PO TABS: 500.0000 mg | ORAL_TABLET | Freq: Two times a day (BID) | ORAL | 0 refills | Status: DC

## 2020-12-26 MED ORDER — ACETAMINOPHEN 500 MG PO TABS
1000.0000 mg | ORAL_TABLET | Freq: Once | ORAL | Status: AC
  Administered 2020-12-26: 1000 mg via ORAL
  Filled 2020-12-26: qty 2
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
## 2020-12-26 NOTE — Discharge Instructions (Addendum)
You are seen in the emergency department today with back pain.  As we discussed the image that we got of your back showed no fractures of your back, or any new fractures of your ribs.  I would like you to continue taking over-the-counter medications such as Tylenol, ibuprofen, or Aleve.  Please follow the package instructions for how often to take this.  Also prescribing you a muscle relaxer, called Robaxin, you can take this twice daily for the next several days.  As we discussed I to return to activity as tolerated, but do not push yourself.  The worsening for back pain is either at rest too much or to be too active.  To to monitor how you are doing, and return to the emergency department for any new or worsening symptoms such as numbness, weakness, difficulties using the bathroom.  It was a pleasure seeing caring for you today, and I hope you start to feel better soon!

## 2020-12-26 NOTE — ED Provider Notes (Signed)
Emergency Medicine Provider Triage Evaluation Note  Keith Thompson , a 65 y.o. male  was evaluated in triage.  Pt complains of upper back pain after fall this morning.  Patient states that he slipped on ice on his porch, and hit his upper back on the step.  He denies hitting his head or any loss of consciousness.  States that his pain is made worse with deep breathing and movement.  He has not taken anything for pain.  He is also complaining of some intermittent numbness in his left hand, with movement of his neck or his back.  Review of Systems  Positive: Upper back pain Negative: Loss of consciousness, ataxia  Physical Exam  BP (!) 191/109 (BP Location: Right Arm)   Pulse 81   Temp 97.7 F (36.5 C) (Oral)   Resp 18   Ht 5' 9.5" (1.765 m)   Wt 90.7 kg   SpO2 97%   BMI 29.11 kg/m  Gen:   Awake, no distress   Resp:  Normal effort  MSK:   Moves extremities without difficulty  Other:  Midline spinal tenderness over thoracic region, step-offs or crepitus  Medical Decision Making  Medically screening exam initiated at 9:57 AM.  Appropriate orders placed.  Keith Thompson was informed that the remainder of the evaluation will be completed by another provider, this initial triage assessment does not replace that evaluation, and the importance of remaining in the ED until their evaluation is complete.  Will obtain imaging to rule out fracture   Keith Thompson T, PA-C 12/26/20 1165    Keith Dessert, MD 01/01/21 [phone]

## 2020-12-26 NOTE — ED Triage Notes (Signed)
Patient reports that he slid on ice on his porch this AM. Patient denies hitting his head or having LOC. Patient c/o left upper back pain. Patient reports pain when he takes a deep breath.

## 2021-01-23 ENCOUNTER — Ambulatory Visit: Payer: Medicare HMO | Admitting: Family Medicine

## 2021-01-26 ENCOUNTER — Ambulatory Visit (INDEPENDENT_AMBULATORY_CARE_PROVIDER_SITE_OTHER): Payer: Medicare HMO | Admitting: Family Medicine

## 2021-01-26 ENCOUNTER — Other Ambulatory Visit: Payer: Self-pay

## 2021-01-26 ENCOUNTER — Encounter: Payer: Self-pay | Admitting: Family Medicine

## 2021-01-26 VITALS — BP 155/91 | HR 74 | Temp 97.9°F | Ht 69.0 in | Wt 209.0 lb

## 2021-01-26 DIAGNOSIS — I10 Essential (primary) hypertension: Secondary | ICD-10-CM

## 2021-01-26 DIAGNOSIS — Z9081 Acquired absence of spleen: Secondary | ICD-10-CM

## 2021-01-26 DIAGNOSIS — E890 Postprocedural hypothyroidism: Secondary | ICD-10-CM

## 2021-01-26 MED ORDER — BETAMETHASONE VALERATE 0.1 % EX OINT: TOPICAL_OINTMENT | Freq: Two times a day (BID) | CUTANEOUS | 5 refills | Status: DC

## 2021-01-26 MED ORDER — DICLOFENAC SODIUM 1 % EX GEL: 4.0000 g | Freq: Four times a day (QID) | CUTANEOUS | 3 refills | Status: DC

## 2021-01-26 MED ORDER — ROSUVASTATIN CALCIUM 5 MG PO TABS: 5.0000 mg | ORAL_TABLET | Freq: Every day | ORAL | 3 refills | Status: DC

## 2021-01-26 MED ORDER — VALSARTAN 40 MG PO TABS
60.0000 mg | ORAL_TABLET | Freq: Every day | ORAL | 1 refills | Status: DC
Start: 2021-01-26 — End: 2021-07-24

## 2021-01-26 MED ORDER — LEVOTHYROXINE SODIUM 125 MCG PO TABS: 125.0000 ug | ORAL_TABLET | Freq: Every day | ORAL | 3 refills | Status: DC

## 2021-01-26 MED ORDER — OMEPRAZOLE 40 MG PO CPDR: 40.0000 mg | DELAYED_RELEASE_CAPSULE | Freq: Every day | ORAL | 3 refills | Status: DC
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
## 2021-01-26 NOTE — Patient Instructions (Signed)
Great to see you today.  I have refilled the medication(s) we provide.   If labs were collected, we will inform you of lab results once received either by echart message or telephone call.   - echart message- for normal results that have been seen by the patient already.   - telephone call: abnormal results or if patient has not viewed results in their echart.  

## 2021-02-08 ENCOUNTER — Ambulatory Visit: Payer: Medicare HMO | Admitting: Family Medicine

## 2021-02-28 DIAGNOSIS — Z0100 Encounter for examination of eyes and vision without abnormal findings: Secondary | ICD-10-CM | POA: Diagnosis not present

## 2021-02-28 DIAGNOSIS — H5200 Hypermetropia, unspecified eye: Secondary | ICD-10-CM | POA: Diagnosis not present

## 2021-03-01 ENCOUNTER — Ambulatory Visit: Payer: Medicare HMO | Admitting: Family Medicine

## 2021-03-01 ENCOUNTER — Ambulatory Visit (INDEPENDENT_AMBULATORY_CARE_PROVIDER_SITE_OTHER): Payer: Medicare HMO | Admitting: Family Medicine

## 2021-03-01 ENCOUNTER — Ambulatory Visit (INDEPENDENT_AMBULATORY_CARE_PROVIDER_SITE_OTHER): Payer: Medicare HMO

## 2021-03-01 ENCOUNTER — Encounter: Payer: Self-pay | Admitting: Family Medicine

## 2021-03-01 ENCOUNTER — Other Ambulatory Visit: Payer: Self-pay

## 2021-03-01 VITALS — BP 126/80 | Temp 98.0°F | Ht 69.0 in | Wt 210.2 lb

## 2021-03-01 DIAGNOSIS — M545 Low back pain, unspecified: Secondary | ICD-10-CM | POA: Diagnosis not present

## 2021-03-01 DIAGNOSIS — R350 Frequency of micturition: Secondary | ICD-10-CM

## 2021-03-01 LAB — POCT URINALYSIS DIPSTICK
Bilirubin, UA: NEGATIVE
Blood, UA: NEGATIVE
Glucose, UA: NEGATIVE
Ketones, UA: NEGATIVE
Leukocytes, UA: NEGATIVE
Nitrite, UA: NEGATIVE
Protein, UA: NEGATIVE
Spec Grav, UA: 1.02 (ref 1.010–1.025)
Urobilinogen, UA: 0.2 E.U./dL
pH, UA: 6 (ref 5.0–8.0)
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
## 2021-03-29 ENCOUNTER — Encounter: Payer: Self-pay | Admitting: Family Medicine

## 2021-03-29 ENCOUNTER — Ambulatory Visit (HOSPITAL_BASED_OUTPATIENT_CLINIC_OR_DEPARTMENT_OTHER)
Admission: RE | Admit: 2021-03-29 | Discharge: 2021-03-29 | Disposition: A | Discharge status: Home / Self Care | Payer: Medicare HMO | Source: Ambulatory Visit | Attending: Family Medicine | Admitting: Family Medicine

## 2021-03-29 ENCOUNTER — Other Ambulatory Visit: Payer: Self-pay

## 2021-03-29 ENCOUNTER — Ambulatory Visit (INDEPENDENT_AMBULATORY_CARE_PROVIDER_SITE_OTHER): Payer: Medicare HMO | Admitting: Family Medicine

## 2021-03-29 VITALS — BP 106/54 | HR 94 | Temp 98.8°F | Ht 69.0 in | Wt 205.0 lb

## 2021-03-29 DIAGNOSIS — I10 Essential (primary) hypertension: Secondary | ICD-10-CM

## 2021-03-29 DIAGNOSIS — J189 Pneumonia, unspecified organism: Secondary | ICD-10-CM

## 2021-03-29 DIAGNOSIS — R051 Acute cough: Secondary | ICD-10-CM | POA: Insufficient documentation

## 2021-03-29 DIAGNOSIS — R062 Wheezing: Secondary | ICD-10-CM | POA: Diagnosis not present

## 2021-03-29 DIAGNOSIS — R0789 Other chest pain: Secondary | ICD-10-CM

## 2021-03-29 DIAGNOSIS — R059 Cough, unspecified: Secondary | ICD-10-CM | POA: Diagnosis not present

## 2021-03-29 LAB — COMPREHENSIVE METABOLIC PANEL
ALT: 20 U/L (ref 0–53)
AST: 22 U/L (ref 0–37)
Albumin: 4.7 g/dL (ref 3.5–5.2)
Alkaline Phosphatase: 67 U/L (ref 39–117)
BUN: 24 mg/dL — ABNORMAL HIGH (ref 6–23)
CO2: 30 mEq/L (ref 19–32)
Calcium: 9.2 mg/dL (ref 8.4–10.5)
Chloride: 98 mEq/L (ref 96–112)
Creatinine, Ser: 1.32 mg/dL (ref 0.40–1.50)
GFR: 55.62 mL/min — ABNORMAL LOW (ref >60.00)
Glucose, Bld: 107 mg/dL — ABNORMAL HIGH (ref 70–99)
Potassium: 4.1 mEq/L (ref 3.5–5.1)
Sodium: 137 mEq/L (ref 135–145)
Total Bilirubin: 0.5 mg/dL (ref 0.2–1.2)
Total Protein: 7.3 g/dL (ref 6.0–8.3)

## 2021-03-29 LAB — CBC WITH DIFFERENTIAL/PLATELET
Basophils Absolute: 0 10*3/uL (ref 0.0–0.1)
Basophils Relative: 0.5 % (ref 0.0–3.0)
Eosinophils Absolute: 0 10*3/uL (ref 0.0–0.7)
Eosinophils Relative: 0 % (ref 0.0–5.0)
HCT: 49.8 % (ref 39.0–52.0)
Hemoglobin: 16.5 g/dL (ref 13.0–17.0)
Lymphocytes Relative: 25.3 % (ref 12.0–46.0)
Lymphs Abs: 2.2 10*3/uL (ref 0.7–4.0)
MCHC: 33.2 g/dL (ref 30.0–36.0)
MCV: 99.4 fl (ref 78.0–100.0)
Monocytes Absolute: 1.7 10*3/uL — ABNORMAL HIGH (ref 0.1–1.0)
Monocytes Relative: 19.5 % — ABNORMAL HIGH (ref 3.0–12.0)
Neutro Abs: 4.7 10*3/uL (ref 1.4–7.7)
Neutrophils Relative %: 54.7 % (ref 43.0–77.0)
Platelets: 195 10*3/uL (ref 150.0–400.0)
RBC: 5.01 Mil/uL (ref 4.22–5.81)
RDW: 14.4 % (ref 11.5–15.5)
WBC: 8.6 10*3/uL (ref 4.0–10.5)

## 2021-03-29 LAB — BRAIN NATRIURETIC PEPTIDE: Pro B Natriuretic peptide (BNP): 20 pg/mL (ref 0.0–100.0)

## 2021-03-29 MED ORDER — METHYLPREDNISOLONE ACETATE 80 MG/ML IJ SUSP
80.0000 mg | Freq: Once | INTRAMUSCULAR | Status: AC
  Administered 2021-03-29: 80 mg via INTRAMUSCULAR

## 2021-03-29 MED ORDER — PREDNISONE 50 MG PO TABS: 50.0000 mg | ORAL_TABLET | Freq: Every day | ORAL | 0 refills | Status: DC

## 2021-03-29 MED ORDER — IPRATROPIUM-ALBUTEROL 0.5-2.5 (3) MG/3ML IN SOLN
3.0000 mL | Freq: Once | RESPIRATORY_TRACT | Status: AC
  Administered 2021-03-29: 3 mL via RESPIRATORY_TRACT

## 2021-03-29 MED ORDER — ALBUTEROL SULFATE HFA 108 (90 BASE) MCG/ACT IN AERS: 2.0000 | INHALATION_SPRAY | Freq: Four times a day (QID) | RESPIRATORY_TRACT | 0 refills | Status: DC | PRN

## 2021-03-29 MED ORDER — ALBUTEROL SULFATE (2.5 MG/3ML) 0.083% IN NEBU
2.5000 mg | INHALATION_SOLUTION | Freq: Once | RESPIRATORY_TRACT | Status: AC
  Administered 2021-03-29: 2.5 mg via RESPIRATORY_TRACT

## 2021-03-29 MED ORDER — DOXYCYCLINE HYCLATE 100 MG PO TABS: 100.0000 mg | ORAL_TABLET | Freq: Two times a day (BID) | ORAL | 0 refills | Status: DC

## 2021-03-29 NOTE — Addendum Note (Signed)
Addended by: Kavin Leech on: 03/29/2021 03:03 PM ? ? Modules accepted: Orders ? ?
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
## 2021-03-29 NOTE — Patient Instructions (Signed)
I am concerned you may have pneumonia.  ?Please have chest xray completed at [facility] today ?Start antibiotic called doxycyline every 12 hrs for 10 days.  ?Start the steroid- prednisone pill once a day tomorrow.  ?Albuterol inhaler prescribed for wheezing or shortness of breath. I would recommend the first 1-2 days to take every 4-6 hours and then start to spread out to 6-8 hrs etc.  ? ? ?Community-Acquired Pneumonia, Adult ?Pneumonia is an infection of the lungs. It causes irritation and swelling in the airways of the lungs. Mucus and fluid may also build up inside the airways. This may cause coughing and trouble breathing. ?One type of pneumonia can happen while you are in a hospital. A different type can happen when you are not in a hospital (community-acquired pneumonia). ?What are the causes? ?This condition is caused by germs (viruses, bacteria, or fungi). Some types of germs can spread from person to person. Pneumonia is not thought to spread from person to person. ?What increases the risk? ?You are more likely to develop this condition if: ?You have a long-term (chronic) disease, such as: ?Disease of the lungs. This may be chronic obstructive pulmonary disease (COPD) or asthma. ?Heart failure. ?Cystic fibrosis. ?Diabetes. ?Kidney disease. ?Sickle cell disease. ?HIV. ?You have other health problems, such as: ?Your body's defense system (immune system) is weak. ?A condition that may cause you to breathe in fluids from your mouth and nose. ?You had your spleen taken out. ?You do not take good care of your teeth and mouth (poor dental hygiene). ?You use or have used tobacco products. ?You travel where the germs that cause this illness are common. ?You are near certain animals or the places they live. ?You are older than 68 years of age. ?What are the signs or symptoms? ?Symptoms of this condition include: ?A cough. ?A fever. ?Sweating or chills. ?Chest pain, often when you breathe deeply or  cough. ?Breathing problems, such as: ?Fast breathing. ?Trouble breathing. ?Shortness of breath. ?Feeling tired (fatigued). ?Muscle aches. ?How is this treated? ?Treatment for this condition depends on many things, such as: ?The cause of your illness. ?Your medicines. ?Your other health problems. ?Most adults can be treated at home. Sometimes, treatment must happen in a hospital. ?Treatment may include medicines to kill germs. ?Medicines may depend on which germ caused your illness. ?Very bad pneumonia is rare. If you get it, you may: ?Have a machine to help you breathe. ?Have fluid taken away from around your lungs. ?Follow these instructions at home: ?Medicines ?Take over-the-counter and prescription medicines only as told by your doctor. ?Take cough medicine only if you are losing sleep. Cough medicine can keep your body from taking mucus away from your lungs. ?If you were prescribed an antibiotic medicine, take it as told by your doctor. Do not stop taking the antibiotic even if you start to feel better. ?Lifestyle ?  ?Do not drink alcohol. ?Do not use any products that contain nicotine or tobacco, such as cigarettes, e-cigarettes, and chewing tobacco. If you need help quitting, ask your doctor. ?Eat a healthy diet. This includes a lot of vegetables, fruits, whole grains, low-fat dairy products, and low-fat (lean) protein. ?General instructions ? ?Rest a lot. Sleep for at least 8 hours each night. ?Sleep with your head and neck raised. Put a few pillows under your head or sleep in a reclining chair. ?Return to your normal activities as told by your doctor. Ask your doctor what activities are safe for you. ?Drink  enough fluid to keep your pee (urine) pale yellow. ?If your throat is sore, rinse your mouth often with salt water. To make salt water, dissolve ?-1 tsp (3-6 g) of salt in 1 cup (237 mL) of warm water. ?Keep all follow-up visits as told by your doctor. This is important. ?How is this prevented? ?You can  lower your risk of pneumonia by: ?Getting the pneumonia shot (vaccine). These shots have different types and schedules. Ask your doctor what works best for you. Think about getting this shot if: ?You are older than 68 years of age. ?You are 58-61 years of age and: ?You are being treated for cancer. ?You have long-term lung disease. ?You have other problems that affect your body's defense system. Ask your doctor if you have one of these. ?Getting your flu shot every year. Ask your doctor which type of shot is best for you. ?Going to the dentist as often as told. ?Washing your hands often with soap and water for at least 20 seconds. If you cannot use soap and water, use hand sanitizer. ?Contact a doctor if: ?You have a fever. ?You lose sleep because your cough medicine does not help. ?Get help right away if: ?You are short of breath and this gets worse. ?You have more chest pain. ?Your sickness gets worse. This is very serious if: ?You are an older adult. ?Your body's defense system is weak. ?You cough up blood. ?These symptoms may be an emergency. Do not wait to see if the symptoms will go away. Get medical help right away. Call your local emergency services (911 in the [place] Do not drive yourself to the hospital. ?Summary ?Pneumonia is an infection of the lungs. ?Community-acquired pneumonia affects people who have not been in the hospital. Certain germs can cause this infection. ?This condition may be treated with medicines that kill germs. ?For very bad pneumonia, you may need a hospital stay and treatment to help with breathing. ?This information is not intended to replace advice given to you by your health care provider. Make sure you discuss any questions you have with your health care provider. ?Document Revised: 10/14/2018 Document Reviewed: 10/14/2018 ?Elsevier Patient Education ? [place]. ? ?

## 2021-03-30 ENCOUNTER — Telehealth: Payer: Self-pay | Admitting: Family Medicine

## 2021-03-30 NOTE — Telephone Encounter (Signed)
Please call patient ?Encouraged him to work on the hydration.  By lab work he is mildly dehydrated. ?The BNP is normal, meaning his symptoms are not related to fluid in the chest from heart failure. ?His total white blood cell count is normal she is reassuring.  However if the white blood cell type called monocyte is mildly elevated.  This sometimes can be an initial sign of infection.  We will discuss in more detail at his follow-up appointment. ?I am still awaiting the chest x-ray result. ?I have reviewed the images, and I do believe he has pneumonia possibly in the left lower lobe.  However, we will need to wait on the formal reading. ? ?For now take the medications as prescribed and we will call once we get the formal result. ? ?Please ask if he is feeling any better today? ?

## 2021-03-30 NOTE — Telephone Encounter (Signed)
Spoke with pt regarding labs and instructions. Pt states that he having some improvement with the chest congestion.  ?

## 2021-04-05 ENCOUNTER — Telehealth: Payer: Self-pay | Admitting: Family Medicine

## 2021-04-05 NOTE — Telephone Encounter (Signed)
Patient was not feeling well and requested a call back in April 2023 ?

## 2021-04-07 ENCOUNTER — Telehealth: Payer: Self-pay

## 2021-04-07 NOTE — Telephone Encounter (Signed)
Pt declined scheduling AWV at this time ?

## 2021-04-14 ENCOUNTER — Encounter: Payer: Self-pay | Admitting: Family Medicine

## 2021-04-14 ENCOUNTER — Ambulatory Visit (INDEPENDENT_AMBULATORY_CARE_PROVIDER_SITE_OTHER): Payer: Medicare HMO | Admitting: Family Medicine

## 2021-04-14 VITALS — BP 91/57 | HR 73 | Temp 97.8°F | Ht 69.0 in | Wt 202.0 lb

## 2021-04-14 DIAGNOSIS — J189 Pneumonia, unspecified organism: Secondary | ICD-10-CM | POA: Diagnosis not present

## 2021-04-14 MED ORDER — ALBUTEROL SULFATE HFA 108 (90 BASE) MCG/ACT IN AERS: 2.0000 | INHALATION_SPRAY | Freq: Four times a day (QID) | RESPIRATORY_TRACT | 0 refills | Status: DC | PRN
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
## 2021-04-14 NOTE — Patient Instructions (Signed)
Great to see you today.  I have refilled the medication(s) we provide.   If labs were collected, we will inform you of lab results once received either by echart message or telephone call.   - echart message- for normal results that have been seen by the patient already.   - telephone call: abnormal results or if patient has not viewed results in their echart.  

## 2021-05-10 ENCOUNTER — Ambulatory Visit (INDEPENDENT_AMBULATORY_CARE_PROVIDER_SITE_OTHER): Payer: Medicare HMO

## 2021-05-10 DIAGNOSIS — Z0000 Encounter for general adult medical examination without abnormal findings: Secondary | ICD-10-CM | POA: Diagnosis not present
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
## 2021-05-10 NOTE — Patient Instructions (Signed)
Keith Thompson , ?Thank you for taking time to come for your Medicare Wellness Visit. I appreciate your ongoing commitment to your health goals. Please review the following plan we discussed and let me know if I can assist you in the future.  ? ?Screening recommendations/referrals: ?Colonoscopy: Done 02/05/17 repeat every 5 years  ?Recommended yearly ophthalmology/optometry visit for glaucoma screening and checkup ?Recommended yearly dental visit for hygiene and checkup ? ?Vaccinations: ?Influenza vaccine: Done 11/12/20 repeat every year  ?Pneumococcal vaccine: Up to date ?Tdap vaccine: due and discussed ?Shingles vaccine: Completed 1st dose 11/07/16   ?Covid-19: declined  ? ?Advanced directives: Please bring a copy of your health care power of attorney and living will to the office at your convenience. ? ?Conditions/risks identified: Stay healthy  ? ?Next appointment: Follow up in one year for your annual wellness visit.  ? ?Preventive Care 75 Years and Older, Male ?Preventive care refers to lifestyle choices and visits with your health care provider that can promote health and wellness. ?What does preventive care include? ?A yearly physical exam. This is also called an annual well check. ?Dental exams once or twice a year. ?Routine eye exams. Ask your health care provider how often you should have your eyes checked. ?Personal lifestyle choices, including: ?Daily care of your teeth and gums. ?Regular physical activity. ?Eating a healthy diet. ?Avoiding tobacco and drug use. ?Limiting alcohol use. ?Practicing safe sex. ?Taking low doses of aspirin every day. ?Taking vitamin and mineral supplements as recommended by your health care provider. ?What happens during an annual well check? ?The services and screenings done by your health care provider during your annual well check will depend on your age, overall health, lifestyle risk factors, and family history of disease. ?Counseling  ?Your health care provider may ask you  questions about your: ?Alcohol use. ?Tobacco use. ?Drug use. ?Emotional well-being. ?Home and relationship well-being. ?Sexual activity. ?Eating habits. ?History of falls. ?Memory and ability to understand (cognition). ?Work and work Statistician. ?Screening  ?You may have the following tests or measurements: ?Height, weight, and BMI. ?Blood pressure. ?Lipid and cholesterol levels. These may be checked every 5 years, or more frequently if you are over 60 years old. ?Skin check. ?Lung cancer screening. You may have this screening every year starting at age 58 if you have a 30-pack-year history of smoking and currently smoke or have quit within the past 15 years. ?Fecal occult blood test (FOBT) of the stool. You may have this test every year starting at age 9. ?Flexible sigmoidoscopy or colonoscopy. You may have a sigmoidoscopy every 5 years or a colonoscopy every 10 years starting at age 73. ?Prostate cancer screening. Recommendations will vary depending on your family history and other risks. ?Hepatitis C blood test. ?Hepatitis B blood test. ?Sexually transmitted disease (STD) testing. ?Diabetes screening. This is done by checking your blood sugar (glucose) after you have not eaten for a while (fasting). You may have this done every 1-3 years. ?Abdominal aortic aneurysm (AAA) screening. You may need this if you are a current or former smoker. ?Osteoporosis. You may be screened starting at age 28 if you are at high risk. ?Talk with your health care provider about your test results, treatment options, and if necessary, the need for more tests. ?Vaccines  ?Your health care provider may recommend certain vaccines, such as: ?Influenza vaccine. This is recommended every year. ?Tetanus, diphtheria, and acellular pertussis (Tdap, Td) vaccine. You may need a Td booster every 10 years. ?Zoster vaccine. You may  need this after age 68. ?Pneumococcal 13-valent conjugate (PCV13) vaccine. One dose is recommended after age  19. ?Pneumococcal polysaccharide (PPSV23) vaccine. One dose is recommended after age 16. ?Talk to your health care provider about which screenings and vaccines you need and how often you need them. ?This information is not intended to replace advice given to you by your health care provider. Make sure you discuss any questions you have with your health care provider. ?Document Released: 01/28/2015 Document Revised: 09/21/2015 Document Reviewed: 11/02/2014 ?Elsevier Interactive Patient Education ? 2017 [employer]. ? ?Fall Prevention in the Home ?Falls can cause injuries. They can happen to people of all ages. There are many things you can do to make your home safe and to help prevent falls. ?What can I do on the outside of my home? ?Regularly fix the edges of walkways and driveways and fix any cracks. ?Remove anything that might make you trip as you walk through a door, such as a raised step or threshold. ?Trim any bushes or trees on the path to your home. ?Use bright outdoor lighting. ?Clear any walking paths of anything that might make someone trip, such as rocks or tools. ?Regularly check to see if handrails are loose or broken. Make sure that both sides of any steps have handrails. ?Any raised decks and porches should have guardrails on the edges. ?Have any leaves, snow, or ice cleared regularly. ?Use sand or salt on walking paths during winter. ?Clean up any spills in your garage right away. This includes oil or grease spills. ?What can I do in the bathroom? ?Use night lights. ?Install grab bars by the toilet and in the tub and shower. Do not use towel bars as grab bars. ?Use non-skid mats or decals in the tub or shower. ?If you need to sit down in the shower, use a plastic, non-slip stool. ?Keep the floor dry. Clean up any water that spills on the floor as soon as it happens. ?Remove soap buildup in the tub or shower regularly. ?Attach bath mats securely with double-sided non-slip rug tape. ?Do not have throw  rugs and other things on the floor that can make you trip. ?What can I do in the bedroom? ?Use night lights. ?Make sure that you have a light by your bed that is easy to reach. ?Do not use any sheets or blankets that are too big for your bed. They should not hang down onto the floor. ?Have a firm chair that has side arms. You can use this for support while you get dressed. ?Do not have throw rugs and other things on the floor that can make you trip. ?What can I do in the kitchen? ?Clean up any spills right away. ?Avoid walking on wet floors. ?Keep items that you use a lot in easy-to-reach places. ?If you need to reach something above you, use a strong step stool that has a grab bar. ?Keep electrical cords out of the way. ?Do not use floor polish or wax that makes floors slippery. If you must use wax, use non-skid floor wax. ?Do not have throw rugs and other things on the floor that can make you trip. ?What can I do with my stairs? ?Do not leave any items on the stairs. ?Make sure that there are handrails on both sides of the stairs and use them. Fix handrails that are broken or loose. Make sure that handrails are as long as the stairways. ?Check any carpeting to make sure that it is firmly attached  to the stairs. Fix any carpet that is loose or worn. ?Avoid having throw rugs at the top or bottom of the stairs. If you do have throw rugs, attach them to the floor with carpet tape. ?Make sure that you have a light switch at the top of the stairs and the bottom of the stairs. If you do not have them, ask someone to add them for you. ?What else can I do to help prevent falls? ?Wear shoes that: ?Do not have high heels. ?Have rubber bottoms. ?Are comfortable and fit you well. ?Are closed at the toe. Do not wear sandals. ?If you use a stepladder: ?Make sure that it is fully opened. Do not climb a closed stepladder. ?Make sure that both sides of the stepladder are locked into place. ?Ask someone to hold it for you, if  possible. ?Clearly mark and make sure that you can see: ?Any grab bars or handrails. ?First and last steps. ?Where the edge of each step is. ?Use tools that help you move around (mobility aids) if they are needed

## 2021-07-22 ENCOUNTER — Other Ambulatory Visit: Payer: Self-pay | Admitting: Family Medicine

## 2021-07-31 ENCOUNTER — Ambulatory Visit (INDEPENDENT_AMBULATORY_CARE_PROVIDER_SITE_OTHER): Payer: Medicare HMO | Admitting: Family Medicine

## 2021-07-31 ENCOUNTER — Encounter: Payer: Self-pay | Admitting: Family Medicine

## 2021-07-31 VITALS — BP 103/66 | HR 64 | Temp 97.8°F | Ht 69.0 in | Wt 205.0 lb

## 2021-07-31 DIAGNOSIS — Z85810 Personal history of malignant neoplasm of tongue: Secondary | ICD-10-CM | POA: Diagnosis not present

## 2021-07-31 DIAGNOSIS — I10 Essential (primary) hypertension: Secondary | ICD-10-CM | POA: Diagnosis not present

## 2021-07-31 DIAGNOSIS — Z0000 Encounter for general adult medical examination without abnormal findings: Secondary | ICD-10-CM | POA: Diagnosis not present

## 2021-07-31 DIAGNOSIS — Z860101 Personal history of adenomatous and serrated colon polyps: Secondary | ICD-10-CM

## 2021-07-31 DIAGNOSIS — E890 Postprocedural hypothyroidism: Secondary | ICD-10-CM

## 2021-07-31 DIAGNOSIS — Z79899 Other long term (current) drug therapy: Secondary | ICD-10-CM

## 2021-07-31 DIAGNOSIS — Z125 Encounter for screening for malignant neoplasm of prostate: Secondary | ICD-10-CM | POA: Diagnosis not present

## 2021-07-31 DIAGNOSIS — D72821 Monocytosis (symptomatic): Secondary | ICD-10-CM | POA: Diagnosis not present

## 2021-07-31 DIAGNOSIS — Z23 Encounter for immunization: Secondary | ICD-10-CM

## 2021-07-31 DIAGNOSIS — E559 Vitamin D deficiency, unspecified: Secondary | ICD-10-CM | POA: Diagnosis not present

## 2021-07-31 DIAGNOSIS — Z86010 Personal history of colonic polyps: Secondary | ICD-10-CM

## 2021-07-31 DIAGNOSIS — R718 Other abnormality of red blood cells: Secondary | ICD-10-CM | POA: Diagnosis not present

## 2021-07-31 LAB — COMPREHENSIVE METABOLIC PANEL
ALT: 17 U/L (ref 0–53)
AST: 21 U/L (ref 0–37)
Albumin: 4.5 g/dL (ref 3.5–5.2)
Alkaline Phosphatase: 61 U/L (ref 39–117)
BUN: 21 mg/dL (ref 6–23)
CO2: 28 mEq/L (ref 19–32)
Calcium: 9.6 mg/dL (ref 8.4–10.5)
Chloride: 105 mEq/L (ref 96–112)
Creatinine, Ser: 0.83 mg/dL (ref 0.40–1.50)
GFR: 90.03 mL/min (ref >60.00)
Glucose, Bld: 97 mg/dL (ref 70–99)
Potassium: 4.7 mEq/L (ref 3.5–5.1)
Sodium: 142 mEq/L (ref 135–145)
Total Bilirubin: 0.9 mg/dL (ref 0.2–1.2)
Total Protein: 6.9 g/dL (ref 6.0–8.3)

## 2021-07-31 LAB — LIPID PANEL
Cholesterol: 161 mg/dL (ref 0–200)
HDL: 54.2 mg/dL (ref >39.00)
LDL Cholesterol: 93 mg/dL (ref 0–99)
NonHDL: 106.83
Total CHOL/HDL Ratio: 3
Triglycerides: 70 mg/dL (ref 0.0–149.0)
VLDL: 14 mg/dL (ref 0.0–40.0)

## 2021-07-31 LAB — CBC
HCT: 47.3 % (ref 39.0–52.0)
Hemoglobin: 15.5 g/dL (ref 13.0–17.0)
MCHC: 32.7 g/dL (ref 30.0–36.0)
MCV: 101.6 fl — ABNORMAL HIGH (ref 78.0–100.0)
Platelets: 204 10*3/uL (ref 150.0–400.0)
RBC: 4.66 Mil/uL (ref 4.22–5.81)
RDW: 13.8 % (ref 11.5–15.5)
WBC: 6.2 10*3/uL (ref 4.0–10.5)

## 2021-07-31 LAB — VITAMIN D 25 HYDROXY (VIT D DEFICIENCY, FRACTURES): VITD: 61.12 ng/mL (ref 30.00–100.00)

## 2021-07-31 LAB — PSA, MEDICARE: PSA: 1.3 ng/ml (ref 0.10–4.00)

## 2021-07-31 LAB — TSH: TSH: 1.54 u[IU]/mL (ref 0.35–5.50)

## 2021-07-31 LAB — HEMOGLOBIN A1C: Hgb A1c MFr Bld: 5.9 % (ref 4.6–6.5)

## 2021-07-31 MED ORDER — ROSUVASTATIN CALCIUM 5 MG PO TABS: 5.0000 mg | ORAL_TABLET | Freq: Every day | ORAL | 3 refills | Status: DC

## 2021-07-31 MED ORDER — TETANUS-DIPHTH-ACELL PERTUSSIS 5-2.5-18.5 LF-MCG/0.5 IM SUSP: 0.5000 mL | Freq: Once | INTRAMUSCULAR | 0 refills | Status: AC

## 2021-07-31 MED ORDER — VALSARTAN 40 MG PO TABS: 60.0000 mg | ORAL_TABLET | Freq: Every day | ORAL | 1 refills | Status: DC

## 2021-07-31 MED ORDER — DICLOFENAC SODIUM 1 % EX GEL: 4.0000 g | Freq: Four times a day (QID) | CUTANEOUS | 3 refills | Status: DC

## 2021-07-31 MED ORDER — OMEPRAZOLE 40 MG PO CPDR: 40.0000 mg | DELAYED_RELEASE_CAPSULE | Freq: Every day | ORAL | 3 refills | Status: DC

## 2021-07-31 MED ORDER — SODIUM FLUORIDE 1.1 % DT CREA: 1.0000 | TOPICAL_CREAM | Freq: Every day | DENTAL | 2 refills | Status: DC
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
## 2021-07-31 NOTE — Patient Instructions (Signed)
No follow-ups on file.        Great to see you today.  I have refilled the medication(s) we provide.   If labs were collected, we will inform you of lab results once received either by echart message or telephone call.   - echart message- for normal results that have been seen by the patient already.   - telephone call: abnormal results or if patient has not viewed results in their echart.   Health Maintenance After Age 68 After age 76, you are at a higher risk for certain long-term diseases and infections as well as injuries from falls. Falls are a major cause of broken bones and head injuries in people who are older than age 41. Getting regular preventive care can help to keep you healthy and well. Preventive care includes getting regular testing and making lifestyle changes as recommended by your health care provider. Talk with your health care provider about: Which screenings and tests you should have. A screening is a test that checks for a disease when you have no symptoms. A diet and exercise plan that is right for you. What should I know about screenings and tests to prevent falls? Screening and testing are the best ways to find a health problem early. Early diagnosis and treatment give you the best chance of managing medical conditions that are common after age 81. Certain conditions and lifestyle choices may make you more likely to have a fall. Your health care provider may recommend: Regular vision checks. Poor vision and conditions such as cataracts can make you more likely to have a fall. If you wear glasses, make sure to get your prescription updated if your vision changes. Medicine review. Work with your health care provider to regularly review all of the medicines you are taking, including over-the-counter medicines. Ask your health care provider about any side effects that may make you more likely to have a fall. Tell your health care provider if any medicines that you take  make you feel dizzy or sleepy. Strength and balance checks. Your health care provider may recommend certain tests to check your strength and balance while standing, walking, or changing positions. Foot health exam. Foot pain and numbness, as well as not wearing proper footwear, can make you more likely to have a fall. Screenings, including: Osteoporosis screening. Osteoporosis is a condition that causes the bones to get weaker and break more easily. Blood pressure screening. Blood pressure changes and medicines to control blood pressure can make you feel dizzy. Depression screening. You may be more likely to have a fall if you have a fear of falling, feel depressed, or feel unable to do activities that you used to do. Alcohol use screening. Using too much alcohol can affect your balance and may make you more likely to have a fall. Follow these instructions at home: Lifestyle Do not drink alcohol if: Your health care provider tells you not to drink. If you drink alcohol: Limit how much you have to: 0-1 drink a day for women. 0-2 drinks a day for men. Know how much alcohol is in your drink. In the [place] one drink equals one 12 oz bottle of beer (355 mL), one 5 oz glass of wine (148 mL), or one 1 oz glass of hard liquor (44 mL). Do not use any products that contain nicotine or tobacco. These products include cigarettes, chewing tobacco, and vaping devices, such as e-cigarettes. If you need help quitting, ask your health care provider. Activity  Follow a regular exercise program to stay fit. This will help you maintain your balance. Ask your health care provider what types of exercise are appropriate for you. If you need a cane or walker, use it as recommended by your health care provider. Wear supportive shoes that have nonskid soles. Safety  Remove any tripping hazards, such as rugs, cords, and clutter. Install safety equipment such as grab bars in bathrooms and safety rails on stairs. Keep  rooms and walkways well-lit. General instructions Talk with your health care provider about your risks for falling. Tell your health care provider if: You fall. Be sure to tell your health care provider about all falls, even ones that seem minor. You feel dizzy, tiredness (fatigue), or off-balance. Take over-the-counter and prescription medicines only as told by your health care provider. These include supplements. Eat a healthy diet and maintain a healthy weight. A healthy diet includes low-fat dairy products, low-fat (lean) meats, and fiber from whole grains, beans, and lots of fruits and vegetables. Stay current with your vaccines. Schedule regular health, dental, and eye exams. Summary Having a healthy lifestyle and getting preventive care can help to protect your health and wellness after age 33. Screening and testing are the best way to find a health problem early and help you avoid having a fall. Early diagnosis and treatment give you the best chance for managing medical conditions that are more common for people who are older than age 7. Falls are a major cause of broken bones and head injuries in people who are older than age 68. Take precautions to prevent a fall at home. Work with your health care provider to learn what changes you can make to improve your health and wellness and to prevent falls. This information is not intended to replace advice given to you by your health care provider. Make sure you discuss any questions you have with your health care provider. Document Revised: 05/23/2020 Document Reviewed: 05/23/2020 Elsevier Patient Education  [place].

## 2021-08-01 ENCOUNTER — Other Ambulatory Visit (INDEPENDENT_AMBULATORY_CARE_PROVIDER_SITE_OTHER): Payer: Medicare HMO

## 2021-08-01 DIAGNOSIS — I10 Essential (primary) hypertension: Secondary | ICD-10-CM | POA: Diagnosis not present

## 2021-08-01 LAB — WHITE CELL DIFFERENTIAL
Band Neutrophils: 0 %
Basophils Relative: 1.1 % (ref 0.0–3.0)
Eosinophils Relative: 1.8 % (ref 0.0–5.0)
Lymphocytes Relative: 37.2 % (ref 12.0–46.0)
Monocytes Relative: 12.6 % — ABNORMAL HIGH (ref 3.0–12.0)
Neutrophils Relative %: 47.3 % (ref 43.0–77.0)

## 2021-08-01 LAB — PATHOLOGIST SMEAR REVIEW

## 2021-08-03 ENCOUNTER — Telehealth: Payer: Self-pay | Admitting: Family Medicine

## 2021-08-03 NOTE — Telephone Encounter (Signed)
Please call patient Liver, kidney and thyroid function are normal electrolytes and diabetes screening/A1c is normal  Cholesterol panel looks great and is at goal with an LDL of 93. Vitamin D levels look good at 61.  I sent his blood cell counts to be reviewed by pathologist, since his blood cell called monocytes had been abnormal and trending up over the last 2 collections.  The pathologist review was reassuring.  And this time his monocytes are still mildly elevated, but barely elevated and greatly improved from last time.  There is a measurement of the blood cell size, his is mildly enlarged this time.  He is always has had levels high in the normal range.  However, this time it is crossed the threshold at 101.6, normal being less than 99.9.  -Sometimes this can be caused by B12 deficiency.  B12 deficiency can be caused by long-term use of medications like omeprazole.  I would encourage him to start B12 sublingual 1000 mcg daily.  This comes in a solution that you hold under the tongue or a tablet that dissolves.  We will continue to monitor his blood cell size and his blood cell counts closely.  Therefore we will repeat these levels at his next routine follow-up.

## 2021-08-03 NOTE — Telephone Encounter (Signed)
Spoke with pt regarding labs and instructions.   

## 2021-10-28 ENCOUNTER — Other Ambulatory Visit: Payer: Self-pay | Admitting: Family Medicine

## 2021-11-17 DIAGNOSIS — L400 Psoriasis vulgaris: Secondary | ICD-10-CM | POA: Diagnosis not present

## 2021-11-17 DIAGNOSIS — K219 Gastro-esophageal reflux disease without esophagitis: Secondary | ICD-10-CM | POA: Diagnosis not present

## 2021-11-17 DIAGNOSIS — Z809 Family history of malignant neoplasm, unspecified: Secondary | ICD-10-CM | POA: Diagnosis not present

## 2021-11-17 DIAGNOSIS — E785 Hyperlipidemia, unspecified: Secondary | ICD-10-CM | POA: Diagnosis not present

## 2021-11-17 DIAGNOSIS — E890 Postprocedural hypothyroidism: Secondary | ICD-10-CM | POA: Diagnosis not present

## 2021-11-17 DIAGNOSIS — M1990 Unspecified osteoarthritis, unspecified site: Secondary | ICD-10-CM | POA: Diagnosis not present

## 2021-11-17 DIAGNOSIS — I10 Essential (primary) hypertension: Secondary | ICD-10-CM | POA: Diagnosis not present

## 2021-11-17 DIAGNOSIS — Z008 Encounter for other general examination: Secondary | ICD-10-CM | POA: Diagnosis not present

## 2021-11-17 DIAGNOSIS — Z880 Allergy status to penicillin: Secondary | ICD-10-CM | POA: Diagnosis not present

## 2022-01-17 ENCOUNTER — Encounter: Payer: Self-pay | Admitting: Family Medicine

## 2022-01-17 ENCOUNTER — Ambulatory Visit (INDEPENDENT_AMBULATORY_CARE_PROVIDER_SITE_OTHER): Payer: Medicare HMO | Admitting: Family Medicine

## 2022-01-17 VITALS — BP 133/80 | HR 72 | Temp 97.8°F | Ht 69.0 in | Wt 212.0 lb

## 2022-01-17 DIAGNOSIS — E785 Hyperlipidemia, unspecified: Secondary | ICD-10-CM | POA: Diagnosis not present

## 2022-01-17 DIAGNOSIS — Z79899 Other long term (current) drug therapy: Secondary | ICD-10-CM

## 2022-01-17 DIAGNOSIS — Z5181 Encounter for therapeutic drug level monitoring: Secondary | ICD-10-CM | POA: Diagnosis not present

## 2022-01-17 DIAGNOSIS — R519 Headache, unspecified: Secondary | ICD-10-CM

## 2022-01-17 DIAGNOSIS — Z23 Encounter for immunization: Secondary | ICD-10-CM | POA: Diagnosis not present

## 2022-01-17 DIAGNOSIS — R718 Other abnormality of red blood cells: Secondary | ICD-10-CM

## 2022-01-17 DIAGNOSIS — E890 Postprocedural hypothyroidism: Secondary | ICD-10-CM | POA: Diagnosis not present

## 2022-01-17 DIAGNOSIS — E559 Vitamin D deficiency, unspecified: Secondary | ICD-10-CM | POA: Diagnosis not present

## 2022-01-17 DIAGNOSIS — I10 Essential (primary) hypertension: Secondary | ICD-10-CM

## 2022-01-17 LAB — MAGNESIUM: Magnesium: 1.9 mg/dL (ref 1.5–2.5)

## 2022-01-17 LAB — CBC WITH DIFFERENTIAL/PLATELET
Basophils Absolute: 0.1 K/uL (ref 0.0–0.1)
Basophils Relative: 1 % (ref 0.0–3.0)
Eosinophils Absolute: 0.1 K/uL (ref 0.0–0.7)
Eosinophils Relative: 1.8 % (ref 0.0–5.0)
HCT: 48.8 % (ref 39.0–52.0)
Hemoglobin: 16.2 g/dL (ref 13.0–17.0)
Lymphocytes Relative: 35 % (ref 12.0–46.0)
Lymphs Abs: 2.2 K/uL (ref 0.7–4.0)
MCHC: 33.2 g/dL (ref 30.0–36.0)
MCV: 99.8 fl (ref 78.0–100.0)
Monocytes Absolute: 0.9 K/uL (ref 0.1–1.0)
Monocytes Relative: 14.6 % — ABNORMAL HIGH (ref 3.0–12.0)
Neutro Abs: 3 K/uL (ref 1.4–7.7)
Neutrophils Relative %: 47.6 % (ref 43.0–77.0)
Platelets: 237 K/uL (ref 150.0–400.0)
RBC: 4.89 Mil/uL (ref 4.22–5.81)
RDW: 13.8 % (ref 11.5–15.5)
WBC: 6.4 K/uL (ref 4.0–10.5)

## 2022-01-17 LAB — B12 AND FOLATE PANEL
Folate: 23.4 ng/mL (ref >5.9)
Vitamin B-12: 637 pg/mL (ref 211–911)

## 2022-01-17 LAB — BASIC METABOLIC PANEL
BUN: 18 mg/dL (ref 6–23)
CO2: 30 mEq/L (ref 19–32)
Calcium: 9.4 mg/dL (ref 8.4–10.5)
Chloride: 103 mEq/L (ref 96–112)
Creatinine, Ser: 0.84 mg/dL (ref 0.40–1.50)
GFR: 89.41 mL/min (ref >60.00)
Glucose, Bld: 96 mg/dL (ref 70–99)
Potassium: 4.6 mEq/L (ref 3.5–5.1)
Sodium: 140 mEq/L (ref 135–145)

## 2022-01-17 MED ORDER — VALSARTAN 40 MG PO TABS: 60.0000 mg | ORAL_TABLET | Freq: Every day | ORAL | 1 refills | Status: DC
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
## 2022-01-17 NOTE — Patient Instructions (Signed)
Cluster Headache Cluster headaches hurt a lot. They normally happen on one side of your head, but they may switch sides. Often, cluster headaches: Cause a lot of pain. Happen for weeks to months. Last from 15 minutes to 3 hours. Happen at the same time each day. Happen at night. Happen many times a day. Happen more often in the fall and springtime. What are the causes? The exact cause is not known. They are not usually caused by foods, changes in body chemicals (hormonal changes), or stress. What increases the risk? Being a male between the ages of 60-2 years old. Smoking or using products that contain nicotine or tobacco. Having elevated levels of body chemical called histamine. This can happen in people who have allergies. Taking certain medicines that cause blood vessels to expand. Having a parent or brother or sister who has cluster headaches. What are the signs or symptoms? Very bad pain on one side of the head that begins behind or around your eye but may spread to your face, head, and neck. Feeling like you may vomit (nauseous). Being sensitive to light. Runny nose and stuffy nose. Swelling of the forehead or face on the affected side. Eye problems. This might include a droopy or swollen eyelid, eye redness, or tearing on the affected side. Feeling restless or upset. Pale skin or a flushed face. How is this treated? Medicines. Oxygen that is breathed in through a mask. Follow these instructions at home: Headache diary Keep a headache diary as told by your doctor. Doing this can help you and your doctor figure out what triggers your headaches. In your headache diary, include information about: The time of day that your headache started and what you were doing when it began. How long your headache lasted. Where your pain started and whether it moved to other areas. The type of pain. Your level of pain. Use a pain scale and rate the pain with a number from 1 (mild) up to 10  (very bad). The treatment that you used, and any change in symptoms after treatment.  Medicines Take over-the-counter and prescription medicines only as told by your health care provider. Ask your doctor if the medicine prescribed to you: Requires you to avoid driving or using machinery. Can cause trouble pooping (constipation). You may need to take these actions to prevent or treat trouble pooping: Drink enough fluid to keep your pee (urine) pale yellow. Take over-the-counter or prescription medicines. Eat foods that are high in fiber. These include beans, whole grains, and fresh fruits and vegetables. Limit foods that are high in fat and processed sugars. These include fried or sweet foods. Lifestyle  Go to bed at the same time each night. Get the same amount of sleep every night. Get 7-9 hours of sleep each night, or the amount recommended by your doctor. Limit or manage stress. Exercise regularly. Exercise for at least 30 minutes, 5 times each week. Eat a healthy diet. Avoid any foods that you know may trigger your headaches. Do not drink alcohol. Do not use any products that contain nicotine or tobacco, such as cigarettes, e-cigarettes, and chewing tobacco. If you need help quitting, ask your doctor. General instructions Use oxygen as told by your doctor. Keep all follow-up visits as told by your health care provider. This is important. Contact a doctor if: Your headaches: Change. Get worse. Happen more often. Your medicines or oxygen are not helping. Get help right away if: You faint. You get weak or lose feeling (have  numbness) on one side of your body or face. You see two of everything (double vision). You feel you may vomit or you vomit and it does stop after many hours. You have trouble with your balance or with walking. You have trouble talking. You have neck pain or stiffness and you have a fever. Summary Cluster headaches hurt a lot. Keep a headache diary. Do not  drink alcohol. Medicines and oxygen may help you feel better. This information is not intended to replace advice given to you by your health care provider. Make sure you discuss any questions you have with your health care provider. Document Revised: 06/15/2021 Document Reviewed: 02/05/2019 Elsevier Patient Education  [place].

## 2022-01-18 ENCOUNTER — Telehealth: Payer: Self-pay | Admitting: Family Medicine

## 2022-01-18 NOTE — Telephone Encounter (Signed)
Please inform patient his kidney function and electrolytes are normal. His B12 and folate levels both look good.  Continue the B12 supplement. The MCV level is decreasing, this is good news and is likely responding to the B12 supplement so continue. His monocytes % are still mildly elevated, but not as high as in the past.  Is monocytes absolute value is now normal.  Therefore we are seeing some improvement.  We will continue to monitor this over time.  If increasing on next appointment, then would consider referring to hematology.  I have ordered the MRI of his head.  I would recommend that once he gets scheduled for his MRI, he call us and make an appointment to follow-up on those results.  Appointment around 2 days after MRI completed.

## 2022-01-18 NOTE — Telephone Encounter (Signed)
LM for pt to return call to discuss.  

## 2022-01-18 NOTE — Telephone Encounter (Signed)
Spoke with patient regarding results/recommendations.  

## 2022-01-27 ENCOUNTER — Other Ambulatory Visit: Payer: Self-pay | Admitting: Family Medicine

## 2022-02-09 ENCOUNTER — Ambulatory Visit
Admission: RE | Admit: 2022-02-09 | Discharge: 2022-02-09 | Disposition: A | Discharge status: Home / Self Care | Payer: Medicare HMO | Source: Ambulatory Visit | Attending: Family Medicine | Admitting: Family Medicine

## 2022-02-09 ENCOUNTER — Telehealth: Payer: Self-pay

## 2022-02-09 DIAGNOSIS — I6522 Occlusion and stenosis of left carotid artery: Secondary | ICD-10-CM | POA: Insufficient documentation

## 2022-02-09 DIAGNOSIS — R519 Headache, unspecified: Secondary | ICD-10-CM

## 2022-02-09 DIAGNOSIS — H7092 Unspecified mastoiditis, left ear: Secondary | ICD-10-CM | POA: Insufficient documentation

## 2022-02-09 DIAGNOSIS — R202 Paresthesia of skin: Secondary | ICD-10-CM

## 2022-02-09 DIAGNOSIS — I679 Cerebrovascular disease, unspecified: Secondary | ICD-10-CM | POA: Insufficient documentation

## 2022-02-09 DIAGNOSIS — I6782 Cerebral ischemia: Secondary | ICD-10-CM | POA: Diagnosis not present

## 2022-02-09 MED ORDER — GADOPICLENOL 0.5 MMOL/ML IV SOLN
10.0000 mL | Freq: Once | INTRAVENOUS | Status: AC | PRN
  Administered 2022-02-09: 10 mL via INTRAVENOUS

## 2022-02-09 NOTE — Telephone Encounter (Signed)
Was seen for his routine chronic condition follow-ups when he mentioned he had  left-sided headaches and neurodeficits on 2 occasions within the past 2 months.  MRI of his brain was completed. Called patient and discussed results with him today.  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   Signal abnormality within portions of the intracranial left internal carotid artery, likely reflecting vessel occlusion.  Recs: Start baby aspirin Urgent referral to VS Report to ED or call EMS immediately if symptoms recur. Reports understanding and appreciated the phone call.

## 2022-02-09 NOTE — Telephone Encounter (Signed)
Rubi from [facility] radiology and stated the following   1. No evidence of acute intracranial abnormality. 2. No evidence of intracranial metastatic disease. 3. Mild multifocal T2 FLAIR hyperintense signal abnormality within the cerebral white matter, nonspecific but most often secondary to chronic small vessel ischemia. 4. Signal abnormality within portions of the intracranial left internal carotid artery, likely reflecting vessel occlusion. 5. Small-volume fluid within the left mastoid air cells

## 2022-02-13 ENCOUNTER — Telehealth: Payer: Self-pay | Admitting: Family Medicine

## 2022-02-13 DIAGNOSIS — R202 Paresthesia of skin: Secondary | ICD-10-CM

## 2022-02-13 DIAGNOSIS — R519 Headache, unspecified: Secondary | ICD-10-CM

## 2022-02-13 DIAGNOSIS — I6522 Occlusion and stenosis of left carotid artery: Secondary | ICD-10-CM

## 2022-02-13 NOTE — Telephone Encounter (Signed)
Replaced urgent referral to vascular surgery.

## 2022-02-14 ENCOUNTER — Other Ambulatory Visit: Payer: Self-pay

## 2022-02-14 DIAGNOSIS — I6522 Occlusion and stenosis of left carotid artery: Secondary | ICD-10-CM

## 2022-02-19 ENCOUNTER — Ambulatory Visit (HOSPITAL_COMMUNITY)
Admission: RE | Admit: 2022-02-19 | Discharge: 2022-02-19 | Disposition: A | Discharge status: Home / Self Care | Payer: Medicare HMO | Source: Ambulatory Visit | Attending: Cardiology | Admitting: Cardiology

## 2022-02-19 DIAGNOSIS — I6522 Occlusion and stenosis of left carotid artery: Secondary | ICD-10-CM | POA: Diagnosis not present

## 2022-02-20 ENCOUNTER — Encounter: Payer: Self-pay | Admitting: Vascular Surgery

## 2022-02-20 ENCOUNTER — Ambulatory Visit: Payer: Medicare HMO | Admitting: Vascular Surgery

## 2022-02-20 VITALS — BP 117/77 | HR 78 | Temp 99.0°F | Resp 20 | Ht 69.0 in | Wt 215.0 lb

## 2022-02-20 DIAGNOSIS — I6522 Occlusion and stenosis of left carotid artery: Secondary | ICD-10-CM | POA: Diagnosis not present

## 2022-02-20 DIAGNOSIS — I6521 Occlusion and stenosis of right carotid artery: Secondary | ICD-10-CM | POA: Diagnosis not present
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
## 2022-02-21 ENCOUNTER — Encounter: Payer: Self-pay | Admitting: Family Medicine

## 2022-02-21 ENCOUNTER — Other Ambulatory Visit: Payer: Self-pay

## 2022-02-21 ENCOUNTER — Ambulatory Visit (INDEPENDENT_AMBULATORY_CARE_PROVIDER_SITE_OTHER): Payer: Medicare HMO | Admitting: Family Medicine

## 2022-02-21 VITALS — BP 138/87 | HR 72 | Temp 97.5°F | Wt 219.2 lb

## 2022-02-21 DIAGNOSIS — I6522 Occlusion and stenosis of left carotid artery: Secondary | ICD-10-CM | POA: Diagnosis not present

## 2022-02-21 DIAGNOSIS — R519 Headache, unspecified: Secondary | ICD-10-CM

## 2022-02-21 DIAGNOSIS — I679 Cerebrovascular disease, unspecified: Secondary | ICD-10-CM | POA: Diagnosis not present

## 2022-02-21 MED ORDER — ROSUVASTATIN CALCIUM 20 MG PO TABS: 20.0000 mg | ORAL_TABLET | Freq: Every day | ORAL | 3 refills | Status: DC

## 2022-02-21 MED ORDER — LEVOTHYROXINE SODIUM 125 MCG PO TABS: 125.0000 ug | ORAL_TABLET | Freq: Every day | ORAL | 0 refills | Status: DC

## 2022-02-21 MED ORDER — CLOPIDOGREL BISULFATE 75 MG PO TABS: 75.0000 mg | ORAL_TABLET | Freq: Every day | ORAL | 2 refills | Status: DC
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
## 2022-02-21 NOTE — Patient Instructions (Signed)
No follow-ups on file.   Increase crestor to 20 mg- new tab will be 20 mg per tab.  Take baby aspirin and Plavix  together for 90 days, then return to baby aspirin only Referral to neurology to discuss headaches.       Great to see you today.  I have refilled the medication(s) we provide.   If labs were collected, we will inform you of lab results once received either by echart message or telephone call.   - echart message- for normal results that have been seen by the patient already.   - telephone call: abnormal results or if patient has not viewed results in their echart.

## 2022-02-23 ENCOUNTER — Encounter (HOSPITAL_COMMUNITY): Payer: Medicare HMO

## 2022-02-23 ENCOUNTER — Encounter: Payer: Medicare HMO | Admitting: Vascular Surgery

## 2022-02-23 ENCOUNTER — Telehealth: Payer: Self-pay | Admitting: Family Medicine

## 2022-02-23 NOTE — Telephone Encounter (Signed)
Spoke with patient regarding results/recommendations. Advised pt to talk with his pharmacist on med interactions.

## 2022-02-23 NOTE — Telephone Encounter (Signed)
Pt walked into office today to ask about possible medication interacting together.  The medications are Omeprazole and Clopidogrel. He would like to know if these two medications are safe to take together.

## 2022-02-23 NOTE — Telephone Encounter (Addendum)
Please advise 

## 2022-02-26 NOTE — Telephone Encounter (Signed)
MyChart message read.

## 2022-02-26 NOTE — Telephone Encounter (Signed)
Continue plavix , this is ok. Just do not take medications within  hours of each other.

## 2022-02-28 ENCOUNTER — Telehealth: Payer: Self-pay | Admitting: *Deleted

## 2022-02-28 ENCOUNTER — Ambulatory Visit: Payer: Medicare HMO | Admitting: Neurology

## 2022-02-28 ENCOUNTER — Encounter: Payer: Self-pay | Admitting: Neurology

## 2022-02-28 VITALS — BP 112/78 | HR 80 | Ht 69.0 in | Wt 218.0 lb

## 2022-02-28 DIAGNOSIS — M5481 Occipital neuralgia: Secondary | ICD-10-CM | POA: Insufficient documentation

## 2022-02-28 DIAGNOSIS — R519 Headache, unspecified: Secondary | ICD-10-CM | POA: Insufficient documentation

## 2022-02-28 DIAGNOSIS — I6522 Occlusion and stenosis of left carotid artery: Secondary | ICD-10-CM | POA: Diagnosis not present

## 2022-02-28 MED ORDER — PREDNISONE 20 MG PO TABS: 60.0000 mg | ORAL_TABLET | Freq: Every day | ORAL | 0 refills | Status: DC
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
## 2022-02-28 NOTE — Telephone Encounter (Signed)
Called [facility] on behalf of Dr Jaynee Eagles and LVM asking for patient to be seen emergently for left eye vision  changes, possibly temporal arteritis but needs to check out the eye. Dr Jaynee Eagles is also trying to call the eye doctor.

## 2022-02-28 NOTE — Patient Instructions (Addendum)
Rule out temporal arteritis Blood work Start steroids which may also help with the pinched nerve in the neck Urgently see your eye doctor for a check left eye Left myofascial pain and very hard cervical muscles PT for dry needling - he lives an hour away he can call us with name Will let you know what Dr. Stanford Breed says   Occipital neuralgia  Occipital Neuralgia    Occipital neuralgia is a type of headache that causes brief episodes of very bad pain in the back of the head. Pain from occipital neuralgia may spread (radiate) to other parts of the head. These headaches may be caused by irritation of the nerves that leave the spinal cord high up in the neck, just below the base of the skull (occipital nerves). The occipital nerves transmit sensations from the back of the head, the top of the head, and the areas behind the ears. What are the causes? This condition can occur without any known cause (primary headache syndrome). In other cases, this condition is caused by pressure on or irritation of one of the two occipital nerves. Pressure and irritation may be due to: Muscle spasm in the neck. Neck injury. Wear and tear of the vertebrae in the neck (osteoarthritis). Disease of the disks that separate the vertebrae. Swollen blood vessels that put pressure on the occipital nerves. Infections. Tumors. Diabetes. What are the signs or symptoms? This condition causes brief burning, stabbing, electric, shocking, or shooting pain in the back of the head that can radiate to the top of the head. It can happen on one side or both sides of the head. It can also cause: Pain behind the eye. Pain triggered by neck movement or hair brushing. Scalp tenderness. Aching in the back of the head between episodes of very bad pain. Pain that gets worse with exposure to bright lights. How is this diagnosed? Your health care provider may diagnose the condition based on a physical exam and your symptoms. Tests may be  done, such as: Imaging studies of the brain and neck (cervical spine), such as an MRI or CT scan. These look for causes of pinched nerves. Applying pressure to the nerves in the neck to try to re-create the pain. Injection of numbing medicine into the occipital nerve areas to see if pain goes away (diagnostic nerve block). How is this treated? Treatment for this condition may begin with simple measures, such as: Rest. Massage. Applying heat or cold to the area. Over-the-counter pain relievers. If these measures do not work, you may need other treatments, including: Medicines, such as: Prescription-strength anti-inflammatory medicines. Muscle relaxants. Anti-seizure medicines, which can relieve pain. Antidepressants, which can relieve pain. Injected medicines, such as medicines that numb the area (local anesthetic) and steroids. Pulsed radiofrequency ablation. This is when wires are implanted to deliver electrical impulses that block pain signals from the occipital nerve. Surgery to relieve nerve pressure. Physical therapy. Follow these instructions at home: Managing pain     Avoid any activities that cause pain. Rest when you have an attack of pain. Try gentle massage to relieve pain. Try a different pillow or sleeping position. If directed, apply heat to the affected area as often as told by your health care provider. Use the heat source that your health care provider recommends, such as a moist heat pack or a heating pad. Place a towel between your skin and the heat source. Leave the heat on for 20-30 minutes. Remove the heat if your skin turns bright  red. This is especially important if you are unable to feel pain, heat, or cold. You have a greater risk of getting burned. If directed, put ice on the back of your head and neck area. To do this: Put ice in a plastic bag. Place a towel between your skin and the bag. Leave the ice on for 20 minutes, 2-3 times a day. Remove the ice  if your skin turns bright red. This is very important. If you cannot feel pain, heat, or cold, you have a greater risk of damage to the area. General instructions Take over-the-counter and prescription medicines only as told by your health care provider. Avoid things that make your symptoms worse, such as bright lights. Try to stay active. Get regular exercise that does not cause pain. Ask your health care provider to suggest safe exercises for you. Work with a physical therapist to learn stretching exercises you can do at home. Practice good posture. Keep all follow-up visits. This is important. Contact a health care provider if: Your medicine is not working. You have new or worsening symptoms. Get help right away if: You have very bad head pain that does not go away. You have a sudden change in vision, balance, or speech. These symptoms may represent a serious problem that is an emergency. Do not wait to see if the symptoms will go away. Get medical help right away. Call your local emergency services (911 in the [place] Do not drive yourself to the hospital. Summary Occipital neuralgia is a type of headache that causes brief episodes of very bad pain in the back of the head. Pain from occipital neuralgia may spread (radiate) to other parts of the head. Treatment for this condition includes rest, massage, and medicines. This information is not intended to replace advice given to you by your health care provider. Make sure you discuss any questions you have with your health care provider. Document Revised: 11/01/2019 Document Reviewed: 11/01/2019 Elsevier Patient Education  [place].  Temporal Arteritis  Temporal arteritis is a condition that causes arteries to become inflamed. It usually affects arteries in your head and face, but arteries in any part of the body can become inflamed. The condition is also called giant cell arteritis.  Temporal arteritis can cause serious problems  such as blindness. Early treatment can help prevent these problems. What are the causes? The cause of this condition is not known. What increases the risk? The following factors may make you more likely to develop this condition: Being older than 50. Being a woman. Being Caucasian. Being of [place], [place], [place], [place] or [place] ancestry. Having a family history of the condition. Having a certain condition that causes muscle pain and stiffness (polymyalgia rheumatica, PMR). What are the signs or symptoms? Some people with temporal arteritis have just one symptom, while others have several symptoms. Most symptoms are related to the head and face. These may include: Headache. Hard, swollen, or tender temples. This is common. Your temples are the areas on either side of your forehead. Pain when combing your hair or when laying your head down. Pain in the jaw when chewing. Pain in the throat or tongue. Problems with your vision, such as sudden loss of vision in one eye, or seeing double. Other symptoms may include: Fever. Tiredness (fatigue). A dry cough. Pain in the hips and shoulders. Pain in the arms during exercise. Depression. Weight loss. How is this diagnosed? This condition may be diagnosed based on: Your symptoms. Your medical  history. A physical exam. Tests, including: Blood tests. A test in which a tissue sample is removed from an artery so it can be examined (biopsy). Imaging tests, such as an ultrasound or MRI. How is this treated? This condition may be treated with: A type of medicine to reduce inflammation (corticosteroid). Medicines to weaken your immune system (immunosuppressants). Other medicines to treat vision problems. You will need to see your health care provider while you are being treated. The medicines used to treat this condition can increase your risk of problems such as bone loss and diabetes. During follow-up visits, your health care  provider will check for problems by: Doing blood tests and bone density tests. Checking your blood pressure and blood sugar. Follow these instructions at home: Medicines Take over-the-counter and prescription medicines only as told by your health care provider. Take any vitamins or supplements recommended by your health care provider. These may include vitamin D and calcium, which help keep your bones from becoming weak. Eating and drinking  Eat a heart-healthy diet. This may include: Eating high-fiber foods, such as fresh fruits and vegetables, whole grains, and beans. Eating heart-healthy fats (omega-3 fats), such as fish, flaxseed, and flaxseed oil. Limiting foods that are high in saturated fat and cholesterol, such as processed and fried foods, fatty meat, and full-fat dairy. Limiting how much salt (sodium) you eat. Include calcium and vitamin D in your diet. Good sources of calcium and vitamin D include: Low-fat dairy products such as milk, yogurt, and cheese. Certain fish, such as fresh or canned salmon, tuna, and sardines. Products that have calcium and vitamin D added to them (fortified products), such as fortified cereals or juice. General instructions Exercise. Talk with your health care provider about what exercises are okay for you. Exercises that increase your heart rate (aerobic exercise), such as walking, are often recommended. Aerobic exercise helps control your blood pressure and prevent bone loss. Stay up to date on all vaccines as directed by your health care provider. Keep all follow-up visits as told by your health care provider. This is important. Contact a health care provider if: Your symptoms get worse. You develop signs of infection, such as fever, swelling, redness, warmth, and tenderness. Get help right away if: You lose your vision. Your pain does not go away, even after you take medicine. You have chest pain. You have trouble breathing. One side of your  face or body suddenly becomes weak or numb. These symptoms may represent a serious problem that is an emergency. Do not wait to see if the symptoms will go away. Get medical help right away. Call your local emergency services (911 in the [place] Do not drive yourself to the hospital. Summary Temporal arteritis is a condition that causes arteries to become inflamed. It usually affects arteries in your head and face. This condition can cause serious problems, such as blindness. Treatment can help prevent these problems. Symptoms may include hard or tender temples, pain in your jaw when chewing, problems with your vision, or pain in your hips and shoulders. Take over-the-counter and prescription medicines as told by your health care provider. This information is not intended to replace advice given to you by your health care provider. Make sure you discuss any questions you have with your health care provider. Document Revised: 03/15/2020 Document Reviewed: 03/15/2020 Elsevier Patient Education  [place].  Prednisone Tablets What is this medication? PREDNISONE (PRED ni sone) treats many conditions such as asthma, allergic reactions, arthritis, inflammatory bowel  diseases, adrenal, and blood or bone marrow disorders. It works by decreasing inflammation, slowing down an overactive immune system, or replacing cortisol normally made in the body. Cortisol is a hormone that plays an important role in how the body responds to stress, illness, and injury. It belongs to a group of medications called steroids. This medicine may be used for other purposes; ask your health care provider or pharmacist if you have questions. COMMON BRAND NAME(S): Deltasone, Predone, Sterapred, Sterapred DS What should I tell my care team before I take this medication? They need to know if you have any of these conditions: Cushing's syndrome Diabetes Glaucoma Heart disease High blood pressure Infection (especially a  virus infection such as chickenpox, cold sores, or herpes) Kidney disease Liver disease Mental illness Myasthenia gravis Osteoporosis Seizures Stomach or intestine problems Thyroid disease An unusual or allergic reaction to lactose, prednisone, other medications, foods, dyes, or preservatives Pregnant or trying to get pregnant Breast-feeding How should I use this medication? Take this medication by mouth with a glass of water. Follow the directions on the prescription label. Take this medication with food. If you are taking this medication once a day, take it in the morning. Do not take more medication than you are told to take. Do not suddenly stop taking your medication because you may develop a severe reaction. Your care team will tell you how much medication to take. If your care team wants you to stop the medication, the dose may be slowly lowered over time to avoid any side effects. Talk to your care team about the use of this medication in children. Special care may be needed. Overdosage: If you think you have taken too much of this medicine contact a poison control center or emergency room at once. NOTE: This medicine is only for you. Do not share this medicine with others. What if I miss a dose? If you miss a dose, take it as soon as you can. If it is almost time for your next dose, talk to your care team. You may need to miss a dose or take an extra dose. Do not take double or extra doses without advice. What may interact with this medication? Do not take this medication with any of the following: Metyrapone Mifepristone This medication may also interact with the following: Aminoglutethimide Amphotericin B Aspirin and aspirin-like medications Barbiturates Certain medications for diabetes, like glipizide or glyburide Cholestyramine Cholinesterase inhibitors Cyclosporine Digoxin Diuretics Ephedrine Male hormones, like estrogens and birth control  pills Isoniazid Ketoconazole NSAIDS, medications for pain and inflammation, like ibuprofen or naproxen Phenytoin Rifampin Toxoids Vaccines Warfarin This list may not describe all possible interactions. Give your health care provider a list of all the medicines, herbs, non-prescription drugs, or dietary supplements you use. Also tell them if you smoke, drink alcohol, or use illegal drugs. Some items may interact with your medicine. What should I watch for while using this medication? Visit your care team for regular checks on your progress. If you are taking this medication over a prolonged period, carry an identification card with your name and address, the type and dose of your medication, and your care team's name and address. This medication may increase your risk of getting an infection. Tell your care team if you are around anyone with measles or chickenpox, or if you develop sores or blisters that do not heal properly. If you are going to have surgery, tell your care team that you have taken this medication within the last  twelve months. Ask your care team about your diet. You may need to lower the amount of salt you eat. This medication may increase blood sugar. Ask your care team if changes in diet or medications are needed if you have diabetes. What side effects may I notice from receiving this medication? Side effects that you should report to your care team as soon as possible: Allergic reactions--skin rash, itching, hives, swelling of the face, lips, tongue, or throat Cushing syndrome--increased fat around the midsection, upper back, neck, or face, pink or purple stretch marks on the skin, thinning, fragile skin that easily bruises, unexpected hair growth High blood sugar (hyperglycemia)--increased thirst or amount of urine, unusual weakness or fatigue, blurry vision Increase in blood pressure Infection--fever, chills, cough, sore throat, wounds that don't heal, pain or trouble when  passing urine, general feeling of discomfort or being unwell Low adrenal gland function--nausea, vomiting, loss of appetite, unusual weakness or fatigue, dizziness Mood and behavior changes--anxiety, nervousness, confusion, hallucinations, irritability, hostility, thoughts of suicide or self-harm, worsening mood, feelings of depression Stomach bleeding--bloody or black, tar-like stools, vomiting blood or brown material that looks like coffee grounds Swelling of the ankles, hands, or feet Side effects that usually do not require medical attention (report to your care team if they continue or are bothersome): Acne General discomfort and fatigue Headache Increase in appetite Nausea Trouble sleeping Weight gain This list may not describe all possible side effects. Call your doctor for medical advice about side effects. You may report side effects to FDA at 1-800-FDA-1088. Where should I keep my medication? Keep out of the reach of children. Store at room temperature between 15 and 30 degrees C (59 and 86 degrees F). Protect from light. Keep container tightly closed. Throw away any unused medication after the expiration date. NOTE: This sheet is a summary. It may not cover all possible information. If you have questions about this medicine, talk to your doctor, pharmacist, or health care provider.  2023 Elsevier/Gold Standard (2007-02-22 00:00:00)

## 2022-03-01 ENCOUNTER — Telehealth: Payer: Self-pay | Admitting: Neurology

## 2022-03-01 DIAGNOSIS — M7918 Myalgia, other site: Secondary | ICD-10-CM

## 2022-03-01 DIAGNOSIS — M5481 Occipital neuralgia: Secondary | ICD-10-CM

## 2022-03-01 LAB — BASIC METABOLIC PANEL
BUN/Creatinine Ratio: 19 (ref 10–24)
BUN: 19 mg/dL (ref 8–27)
CO2: 23 mmol/L (ref 20–29)
Calcium: 9.4 mg/dL (ref 8.6–10.2)
Chloride: 106 mmol/L (ref 96–106)
Creatinine, Ser: 1.02 mg/dL (ref 0.76–1.27)
Glucose: 104 mg/dL — ABNORMAL HIGH (ref 70–99)
Potassium: 4.7 mmol/L (ref 3.5–5.2)
Sodium: 144 mmol/L (ref 134–144)
eGFR: 80 mL/min/{1.73_m2} (ref >59)

## 2022-03-01 LAB — SEDIMENTATION RATE: Sed Rate: 2 mm/hr (ref 0–30)

## 2022-03-01 LAB — C-REACTIVE PROTEIN: CRP: <1 mg/L (ref 0–10)

## 2022-03-01 NOTE — Addendum Note (Signed)
Addended by: Gildardo Griffes on: 03/01/2022 03:05 PM   Modules accepted: Orders

## 2022-03-01 NOTE — Telephone Encounter (Signed)
Pt called and stated Dr. Jaynee Eagles wanted him to find a place close to his home that does dry needling. Pt said he can be referred to [facility] Fax: [phone]

## 2022-03-01 NOTE — Telephone Encounter (Signed)
Pod 4: please Call patient. Esr/crp normal so there is no sign of temporal arteritis. I would however finish out the steroid burst as it may help with his neck pain and headache. If the headache doesn't go away after the steroids have him contact us and we can see him sooner. I have not heard back from Dr. Standley Dakins yet about his vertebral arteries on the doppler that was completed. Also ask him what happened with his eye doctor yesterday, did they find anything in his left eye? Thanks (cc Dr. Raoul Pitch)

## 2022-03-01 NOTE — Telephone Encounter (Signed)
Noted, thanks. I have placed the order for PT for dry needling with location noted below by patient.

## 2022-03-01 NOTE — Telephone Encounter (Signed)
I called the patient and relayed the entire message below by Dr Jaynee Eagles. Patient verbalized understanding. He will call if he does not see improvement in headache after he finishes the steroids. He stated that yesterday he was referred by Dr Newman Nip to Dr Noel Christmas. He states Dr Manuella Ghazi completed 5 different tests and patient stated they didn't find anything and everything looked good. Patient will also be on the lookout for a call from the PT office next week. He will call them if he doesn't hear by mid-week. Patient verbalized appreciation for the call.

## 2022-03-05 ENCOUNTER — Telehealth: Payer: Self-pay | Admitting: Neurology

## 2022-03-05 NOTE — Telephone Encounter (Signed)
Referral for physical therapy fax to [facility] as requested by Neurology. Phone: [phone], Fax; [phone]

## 2022-03-07 ENCOUNTER — Encounter: Payer: Self-pay | Admitting: Gastroenterology

## 2022-03-08 ENCOUNTER — Telehealth: Payer: Self-pay | Admitting: Neurology

## 2022-03-08 NOTE — Telephone Encounter (Signed)
Wait and see after dry needling bc I really don;t think he damaged any blood vesels I think he pinched a nerve and he has very tight sore neck muscles thanks

## 2022-03-08 NOTE — Telephone Encounter (Signed)
Spoke with the patient and discussed the message below by Dr Jaynee Eagles. He said he is still a little bit sore but he said we can hold off for now and see how things go in the next week or so. I asked for him to update [place]. He verbalized understanding and appreciation.

## 2022-03-08 NOTE — Telephone Encounter (Signed)
Spoke with patient and discussed most recent message below from Dr Jaynee Eagles. He is in agreement with plan below to see how he does with dry needling.

## 2022-03-08 NOTE — Telephone Encounter (Addendum)
I returned the patient's call.  He wanted to go over the results again as he was not sure he heard me well during the previous call.  We went over the results again as noted below by Dr. Jaynee Eagles.  He now states that he would like to see what Dr. Jaynee Eagles recommends regarding possible completion of CT.  He states the pain comes and goes, is a little sore, and he has some tightness in his neck. It's not as bad as it was.  He states he has dry needling scheduled for March 11.  I told him I would send his message over to Dr. Jaynee Eagles and we would give him a call back by Monday with recommendation on the neck steps.  He verbalized understanding and appreciation for the call back.

## 2022-03-08 NOTE — Telephone Encounter (Signed)
Pt called back needing to speak to the RN again regarding his results. Please advise.

## 2022-03-08 NOTE — Telephone Encounter (Signed)
Please let the patient know that I contacted the doctor who completed his carotid dopplers and the dopplers did not see a vertebral dissection (the blood vessel in the back of the neck where he hurts that we discussed in his appointment) but the dopplers don't always catch a dissection in that artery.  If patient is still in pain I can order a CT scan to look at those arteries and ensure he did not damage them when he twisted his head which caused the pain in the back of the head and neck. If he is feeling better I would hold off and not do anymore testing thanks

## 2022-03-16 ENCOUNTER — Other Ambulatory Visit: Payer: Self-pay | Admitting: Family Medicine

## 2022-03-20 ENCOUNTER — Other Ambulatory Visit: Payer: Self-pay | Admitting: Family Medicine

## 2022-03-26 DIAGNOSIS — M5481 Occipital neuralgia: Secondary | ICD-10-CM | POA: Diagnosis not present

## 2022-03-26 DIAGNOSIS — R29898 Other symptoms and signs involving the musculoskeletal system: Secondary | ICD-10-CM | POA: Diagnosis not present

## 2022-03-26 DIAGNOSIS — R519 Headache, unspecified: Secondary | ICD-10-CM | POA: Diagnosis not present

## 2022-03-26 DIAGNOSIS — I6522 Occlusion and stenosis of left carotid artery: Secondary | ICD-10-CM | POA: Diagnosis not present

## 2022-03-26 DIAGNOSIS — R6889 Other general symptoms and signs: Secondary | ICD-10-CM | POA: Diagnosis not present

## 2022-03-26 DIAGNOSIS — M7918 Myalgia, other site: Secondary | ICD-10-CM | POA: Diagnosis not present

## 2022-04-02 DIAGNOSIS — R29898 Other symptoms and signs involving the musculoskeletal system: Secondary | ICD-10-CM | POA: Diagnosis not present

## 2022-04-02 DIAGNOSIS — R519 Headache, unspecified: Secondary | ICD-10-CM | POA: Diagnosis not present

## 2022-04-02 DIAGNOSIS — I6522 Occlusion and stenosis of left carotid artery: Secondary | ICD-10-CM | POA: Diagnosis not present

## 2022-04-02 DIAGNOSIS — M7918 Myalgia, other site: Secondary | ICD-10-CM | POA: Diagnosis not present

## 2022-04-02 DIAGNOSIS — R6889 Other general symptoms and signs: Secondary | ICD-10-CM | POA: Diagnosis not present

## 2022-04-02 DIAGNOSIS — M5481 Occipital neuralgia: Secondary | ICD-10-CM | POA: Diagnosis not present

## 2022-04-11 ENCOUNTER — Encounter: Payer: Self-pay | Admitting: Family Medicine

## 2022-04-11 ENCOUNTER — Ambulatory Visit (INDEPENDENT_AMBULATORY_CARE_PROVIDER_SITE_OTHER): Payer: Medicare HMO | Admitting: Family Medicine

## 2022-04-11 VITALS — BP 124/81 | HR 73 | Temp 98.1°F | Wt 218.4 lb

## 2022-04-11 DIAGNOSIS — I679 Cerebrovascular disease, unspecified: Secondary | ICD-10-CM

## 2022-04-11 DIAGNOSIS — I998 Other disorder of circulatory system: Secondary | ICD-10-CM | POA: Diagnosis not present

## 2022-04-11 DIAGNOSIS — Z79899 Other long term (current) drug therapy: Secondary | ICD-10-CM | POA: Diagnosis not present

## 2022-04-11 DIAGNOSIS — E890 Postprocedural hypothyroidism: Secondary | ICD-10-CM | POA: Diagnosis not present

## 2022-04-11 DIAGNOSIS — E785 Hyperlipidemia, unspecified: Secondary | ICD-10-CM

## 2022-04-11 DIAGNOSIS — R519 Headache, unspecified: Secondary | ICD-10-CM | POA: Diagnosis not present

## 2022-04-11 DIAGNOSIS — E559 Vitamin D deficiency, unspecified: Secondary | ICD-10-CM

## 2022-04-11 DIAGNOSIS — Z5181 Encounter for therapeutic drug level monitoring: Secondary | ICD-10-CM | POA: Diagnosis not present

## 2022-04-11 DIAGNOSIS — I10 Essential (primary) hypertension: Secondary | ICD-10-CM

## 2022-04-11 DIAGNOSIS — I6522 Occlusion and stenosis of left carotid artery: Secondary | ICD-10-CM | POA: Diagnosis not present

## 2022-04-11 DIAGNOSIS — R079 Chest pain, unspecified: Secondary | ICD-10-CM

## 2022-04-11 LAB — LIPID PANEL
Cholesterol: 145 mg/dL (ref 0–200)
HDL: 53.5 mg/dL (ref >39.00)
LDL Cholesterol: 72 mg/dL (ref 0–99)
NonHDL: 91.46
Total CHOL/HDL Ratio: 3
Triglycerides: 99 mg/dL (ref 0.0–149.0)
VLDL: 19.8 mg/dL (ref 0.0–40.0)

## 2022-04-11 LAB — TSH: TSH: 2.31 u[IU]/mL (ref 0.35–5.50)

## 2022-04-11 MED ORDER — LEVOTHYROXINE SODIUM 125 MCG PO TABS: 125.0000 ug | ORAL_TABLET | Freq: Every day | ORAL | 0 refills | Status: DC

## 2022-04-11 MED ORDER — OMEPRAZOLE 40 MG PO CPDR: 40.0000 mg | DELAYED_RELEASE_CAPSULE | Freq: Every day | ORAL | 3 refills | Status: DC

## 2022-04-11 MED ORDER — CLOPIDOGREL BISULFATE 75 MG PO TABS: 75.0000 mg | ORAL_TABLET | Freq: Every day | ORAL | 2 refills | Status: DC

## 2022-04-11 MED ORDER — VALSARTAN 40 MG PO TABS: 60.0000 mg | ORAL_TABLET | Freq: Every day | ORAL | 1 refills | Status: DC

## 2022-04-11 NOTE — Patient Instructions (Addendum)
Return in about 24 weeks (around 09/26/2022) for Routine chronic condition follow-up.        Great to see you today.  I have refilled the medication(s) we provide.   If labs were collected, we will inform you of lab results once received either by echart message or telephone call.   - echart message- for normal results that have been seen by the patient already.   - telephone call: abnormal results or if patient has not viewed results in their echart.
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
## 2022-04-12 ENCOUNTER — Telehealth: Payer: Self-pay | Admitting: Family Medicine

## 2022-04-12 DIAGNOSIS — R6889 Other general symptoms and signs: Secondary | ICD-10-CM | POA: Diagnosis not present

## 2022-04-12 DIAGNOSIS — I6522 Occlusion and stenosis of left carotid artery: Secondary | ICD-10-CM | POA: Diagnosis not present

## 2022-04-12 DIAGNOSIS — M5481 Occipital neuralgia: Secondary | ICD-10-CM | POA: Diagnosis not present

## 2022-04-12 DIAGNOSIS — R29898 Other symptoms and signs involving the musculoskeletal system: Secondary | ICD-10-CM | POA: Diagnosis not present

## 2022-04-12 DIAGNOSIS — R519 Headache, unspecified: Secondary | ICD-10-CM | POA: Diagnosis not present

## 2022-04-12 DIAGNOSIS — M7918 Myalgia, other site: Secondary | ICD-10-CM | POA: Diagnosis not present

## 2022-04-12 MED ORDER — LEVOTHYROXINE SODIUM 125 MCG PO TABS: 125.0000 ug | ORAL_TABLET | Freq: Every day | ORAL | 3 refills | Status: DC

## 2022-04-12 NOTE — Telephone Encounter (Signed)
Please inform patient Thyroid function is normal.  Continue supplementation at current dose Cholesterol levels look great on new dose.

## 2022-04-12 NOTE — Telephone Encounter (Signed)
Spoke with patient regarding results/recommendations.  

## 2022-04-15 ENCOUNTER — Other Ambulatory Visit: Payer: Self-pay | Admitting: Family Medicine

## 2022-04-19 DIAGNOSIS — R519 Headache, unspecified: Secondary | ICD-10-CM | POA: Diagnosis not present

## 2022-04-19 DIAGNOSIS — R29898 Other symptoms and signs involving the musculoskeletal system: Secondary | ICD-10-CM | POA: Diagnosis not present

## 2022-04-19 DIAGNOSIS — M5481 Occipital neuralgia: Secondary | ICD-10-CM | POA: Diagnosis not present

## 2022-04-19 DIAGNOSIS — M7918 Myalgia, other site: Secondary | ICD-10-CM | POA: Diagnosis not present

## 2022-04-19 DIAGNOSIS — I6522 Occlusion and stenosis of left carotid artery: Secondary | ICD-10-CM | POA: Diagnosis not present

## 2022-04-19 DIAGNOSIS — R6889 Other general symptoms and signs: Secondary | ICD-10-CM | POA: Diagnosis not present

## 2022-04-20 ENCOUNTER — Ambulatory Visit
Admission: RE | Admit: 2022-04-20 | Discharge: 2022-04-20 | Disposition: A | Discharge status: Home / Self Care | Payer: Medicare HMO | Source: Ambulatory Visit | Attending: Family Medicine | Admitting: Family Medicine

## 2022-04-20 DIAGNOSIS — I998 Other disorder of circulatory system: Secondary | ICD-10-CM

## 2022-04-20 DIAGNOSIS — I6522 Occlusion and stenosis of left carotid artery: Secondary | ICD-10-CM

## 2022-04-20 DIAGNOSIS — I2510 Atherosclerotic heart disease of native coronary artery without angina pectoris: Secondary | ICD-10-CM | POA: Diagnosis not present

## 2022-04-20 DIAGNOSIS — M954 Acquired deformity of chest and rib: Secondary | ICD-10-CM | POA: Diagnosis not present

## 2022-04-20 DIAGNOSIS — J90 Pleural effusion, not elsewhere classified: Secondary | ICD-10-CM | POA: Diagnosis not present

## 2022-04-20 DIAGNOSIS — R079 Chest pain, unspecified: Secondary | ICD-10-CM

## 2022-04-20 DIAGNOSIS — S2242XA Multiple fractures of ribs, left side, initial encounter for closed fracture: Secondary | ICD-10-CM | POA: Diagnosis not present

## 2022-04-20 MED ORDER — IOPAMIDOL (ISOVUE-370) INJECTION 76%
75.0000 mL | Freq: Once | INTRAVENOUS | Status: AC | PRN
  Administered 2022-04-20: 75 mL via INTRAVENOUS

## 2022-04-23 ENCOUNTER — Telehealth: Payer: Self-pay | Admitting: Family Medicine

## 2022-04-23 DIAGNOSIS — R079 Chest pain, unspecified: Secondary | ICD-10-CM

## 2022-04-23 DIAGNOSIS — E785 Hyperlipidemia, unspecified: Secondary | ICD-10-CM

## 2022-04-23 DIAGNOSIS — I25118 Atherosclerotic heart disease of native coronary artery with other forms of angina pectoris: Secondary | ICD-10-CM

## 2022-04-23 DIAGNOSIS — I10 Essential (primary) hypertension: Secondary | ICD-10-CM

## 2022-04-23 DIAGNOSIS — I2510 Atherosclerotic heart disease of native coronary artery without angina pectoris: Secondary | ICD-10-CM | POA: Insufficient documentation
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
## 2022-04-25 NOTE — Telephone Encounter (Signed)
Patient came into the office inquiring of results. Results provided to patient and patient expressed understanding. Patient will call Cardiology in [place] to schedule appointment.

## 2022-04-26 DIAGNOSIS — M7918 Myalgia, other site: Secondary | ICD-10-CM | POA: Diagnosis not present

## 2022-04-26 DIAGNOSIS — M5481 Occipital neuralgia: Secondary | ICD-10-CM | POA: Diagnosis not present

## 2022-04-26 DIAGNOSIS — R519 Headache, unspecified: Secondary | ICD-10-CM | POA: Diagnosis not present

## 2022-04-26 DIAGNOSIS — R6889 Other general symptoms and signs: Secondary | ICD-10-CM | POA: Diagnosis not present

## 2022-04-26 DIAGNOSIS — R29898 Other symptoms and signs involving the musculoskeletal system: Secondary | ICD-10-CM | POA: Diagnosis not present

## 2022-04-26 DIAGNOSIS — I6522 Occlusion and stenosis of left carotid artery: Secondary | ICD-10-CM | POA: Diagnosis not present

## 2022-05-11 ENCOUNTER — Other Ambulatory Visit: Payer: Medicare HMO

## 2022-05-16 ENCOUNTER — Ambulatory Visit (INDEPENDENT_AMBULATORY_CARE_PROVIDER_SITE_OTHER): Payer: Medicare HMO

## 2022-05-16 VITALS — Wt 210.0 lb

## 2022-05-16 DIAGNOSIS — Z1211 Encounter for screening for malignant neoplasm of colon: Secondary | ICD-10-CM | POA: Diagnosis not present

## 2022-05-16 DIAGNOSIS — Z0000 Encounter for general adult medical examination without abnormal findings: Secondary | ICD-10-CM

## 2022-05-16 DIAGNOSIS — Z86010 Personal history of colonic polyps: Secondary | ICD-10-CM
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
## 2022-05-16 NOTE — Patient Instructions (Signed)
Mr. Acebo , Thank you for taking time to come for your Medicare Wellness Visit. I appreciate your ongoing commitment to your health goals. Please review the following plan we discussed and let me know if I can assist you in the future.   These are the goals we discussed:  Goals      Patient Stated     Stay healthy      Patient Stated     Stay healthy and active         This is a list of the screening recommended for you and due dates:  Health Maintenance  Topic Date Due   Colon Cancer Screening  02/05/2022   Flu Shot  08/16/2022   Medicare Annual Wellness Visit  05/16/2023   DTaP/Tdap/Td vaccine (2 - Td or Tdap) 07/29/2031   Pneumonia Vaccine  Completed   Hepatitis C Screening: USPSTF Recommendation to screen - Ages 69-69 yo.  Completed   HPV Vaccine  Aged Out   COVID-19 Vaccine  Discontinued   Zoster (Shingles) Vaccine  Discontinued    Advanced directives: Please bring a copy of your health care power of attorney and living will to the office at your convenience.   Conditions/risks identified: stay healthy  Next appointment: Follow up in one year for your annual wellness visit.   Preventive Care 46 Years and Older, Male  Preventive care refers to lifestyle choices and visits with your health care provider that can promote health and wellness. What does preventive care include? A yearly physical exam. This is also called an annual well check. Dental exams once or twice a year. Routine eye exams. Ask your health care provider how often you should have your eyes checked. Personal lifestyle choices, including: Daily care of your teeth and gums. Regular physical activity. Eating a healthy diet. Avoiding tobacco and drug use. Limiting alcohol use. Practicing safe sex. Taking low doses of aspirin every day. Taking vitamin and mineral supplements as recommended by your health care provider. What happens during an annual well check? The services and screenings done by  your health care provider during your annual well check will depend on your age, overall health, lifestyle risk factors, and family history of disease. Counseling  Your health care provider may ask you questions about your: Alcohol use. Tobacco use. Drug use. Emotional well-being. Home and relationship well-being. Sexual activity. Eating habits. History of falls. Memory and ability to understand (cognition). Work and work Astronomer. Screening  You may have the following tests or measurements: Height, weight, and BMI. Blood pressure. Lipid and cholesterol levels. These may be checked every 5 years, or more frequently if you are over 35 years old. Skin check. Lung cancer screening. You may have this screening every year starting at age 69 if you have a 30-pack-year history of smoking and currently smoke or have quit within the past 15 years. Fecal occult blood test (FOBT) of the stool. You may have this test every year starting at age 69. Flexible sigmoidoscopy or colonoscopy. You may have a sigmoidoscopy every 5 years or a colonoscopy every 10 years starting at age 69. Prostate cancer screening. Recommendations will vary depending on your family history and other risks. Hepatitis C blood test. Hepatitis B blood test. Sexually transmitted disease (STD) testing. Diabetes screening. This is done by checking your blood sugar (glucose) after you have not eaten for a while (fasting). You may have this done every 1-3 years. Abdominal aortic aneurysm (AAA) screening. You may need this if you  are a current or former smoker. Osteoporosis. You may be screened starting at age 69 if you are at high risk. Talk with your health care provider about your test results, treatment options, and if necessary, the need for more tests. Vaccines  Your health care provider may recommend certain vaccines, such as: Influenza vaccine. This is recommended every year. Tetanus, diphtheria, and acellular pertussis  (Tdap, Td) vaccine. You may need a Td booster every 10 years. Zoster vaccine. You may need this after age 69. Pneumococcal 13-valent conjugate (PCV13) vaccine. One dose is recommended after age 69. Pneumococcal polysaccharide (PPSV23) vaccine. One dose is recommended after age 69. Talk to your health care provider about which screenings and vaccines you need and how often you need them. This information is not intended to replace advice given to you by your health care provider. Make sure you discuss any questions you have with your health care provider. Document Released: 01/28/2015 Document Revised: 09/21/2015 Document Reviewed: 11/02/2014 Elsevier Interactive Patient Education  2017 [employer] Prevention in the Home Falls can cause injuries. They can happen to people of all ages. There are many things you can do to make your home safe and to help prevent falls. What can I do on the outside of my home? Regularly fix the edges of walkways and driveways and fix any cracks. Remove anything that might make you trip as you walk through a door, such as a raised step or threshold. Trim any bushes or trees on the path to your home. Use bright outdoor lighting. Clear any walking paths of anything that might make someone trip, such as rocks or tools. Regularly check to see if handrails are loose or broken. Make sure that both sides of any steps have handrails. Any raised decks and porches should have guardrails on the edges. Have any leaves, snow, or ice cleared regularly. Use sand or salt on walking paths during winter. Clean up any spills in your garage right away. This includes oil or grease spills. What can I do in the bathroom? Use night lights. Install grab bars by the toilet and in the tub and shower. Do not use towel bars as grab bars. Use non-skid mats or decals in the tub or shower. If you need to sit down in the shower, use a plastic, non-slip stool. Keep the floor dry. Clean  up any water that spills on the floor as soon as it happens. Remove soap buildup in the tub or shower regularly. Attach bath mats securely with double-sided non-slip rug tape. Do not have throw rugs and other things on the floor that can make you trip. What can I do in the bedroom? Use night lights. Make sure that you have a light by your bed that is easy to reach. Do not use any sheets or blankets that are too big for your bed. They should not hang down onto the floor. Have a firm chair that has side arms. You can use this for support while you get dressed. Do not have throw rugs and other things on the floor that can make you trip. What can I do in the kitchen? Clean up any spills right away. Avoid walking on wet floors. Keep items that you use a lot in easy-to-reach places. If you need to reach something above you, use a strong step stool that has a grab bar. Keep electrical cords out of the way. Do not use floor polish or wax that makes floors slippery. If you  must use wax, use non-skid floor wax. Do not have throw rugs and other things on the floor that can make you trip. What can I do with my stairs? Do not leave any items on the stairs. Make sure that there are handrails on both sides of the stairs and use them. Fix handrails that are broken or loose. Make sure that handrails are as long as the stairways. Check any carpeting to make sure that it is firmly attached to the stairs. Fix any carpet that is loose or worn. Avoid having throw rugs at the top or bottom of the stairs. If you do have throw rugs, attach them to the floor with carpet tape. Make sure that you have a light switch at the top of the stairs and the bottom of the stairs. If you do not have them, ask someone to add them for you. What else can I do to help prevent falls? Wear shoes that: Do not have high heels. Have rubber bottoms. Are comfortable and fit you well. Are closed at the toe. Do not wear sandals. If you  use a stepladder: Make sure that it is fully opened. Do not climb a closed stepladder. Make sure that both sides of the stepladder are locked into place. Ask someone to hold it for you, if possible. Clearly mark and make sure that you can see: Any grab bars or handrails. First and last steps. Where the edge of each step is. Use tools that help you move around (mobility aids) if they are needed. These include: Canes. Walkers. Scooters. Crutches. Turn on the lights when you go into a dark area. Replace any light bulbs as soon as they burn out. Set up your furniture so you have a clear path. Avoid moving your furniture around. If any of your floors are uneven, fix them. If there are any pets around you, be aware of where they are. Review your medicines with your doctor. Some medicines can make you feel dizzy. This can increase your chance of falling. Ask your doctor what other things that you can do to help prevent falls. This information is not intended to replace advice given to you by your health care provider. Make sure you discuss any questions you have with your health care provider. Document Released: 10/28/2008 Document Revised: 06/09/2015 Document Reviewed: 02/05/2014 Elsevier Interactive Patient Education  2017 [employer].

## 2022-05-16 NOTE — Addendum Note (Signed)
Addended by: Marzella Schlein on: 05/16/2022 03:54 PM   Modules accepted: Orders

## 2022-05-29 ENCOUNTER — Encounter: Payer: Self-pay | Admitting: Cardiology
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
## 2022-05-30 ENCOUNTER — Encounter: Payer: Self-pay | Admitting: Gastroenterology

## 2022-05-31 ENCOUNTER — Ambulatory Visit: Payer: Medicare HMO | Attending: Cardiology | Admitting: Cardiology

## 2022-05-31 ENCOUNTER — Encounter: Payer: Self-pay | Admitting: Cardiology

## 2022-05-31 VITALS — BP 156/90 | HR 74 | Ht 69.0 in | Wt 212.0 lb

## 2022-05-31 DIAGNOSIS — E785 Hyperlipidemia, unspecified: Secondary | ICD-10-CM

## 2022-05-31 DIAGNOSIS — R079 Chest pain, unspecified: Secondary | ICD-10-CM | POA: Diagnosis not present

## 2022-05-31 MED ORDER — ROSUVASTATIN CALCIUM 40 MG PO TABS: 40.0000 mg | ORAL_TABLET | Freq: Every day | ORAL | 3 refills | Status: DC

## 2022-05-31 MED ORDER — ASPIRIN 81 MG PO TBEC: 81.0000 mg | DELAYED_RELEASE_TABLET | Freq: Every day | ORAL | 3 refills | Status: AC

## 2022-05-31 NOTE — Patient Instructions (Signed)
Medication Instructions:  START aspirin 81 mg daily INCREASE rosuvastatin (Crestor) to 40 mg daily  *If you need a refill on your cardiac medications before your next appointment, please call your pharmacy*  Lab Work: Please return for FASTING labs in 4 months (Lipid)  Our in office lab hours are Monday-Friday 8:00-4:00, closed for lunch 1-2 pm.  No appointment needed.  [facility] locations:   [facility] - [address] - [address] ([facility]) - [address] [phone] [address]   [place] - [address]    [place]  - [address]    [place] - [address] - [address] [employer]  - [address] [place] - [address] [employer] Technology Officer)  Testing/Procedures: Your physician has requested that you have an exercise tolerance test. For further information please visit [email or web address] Please also follow instruction sheet, as given.  Follow-Up: At [facility], you and your health needs are our priority.  As part of our continuing mission to provide you with exceptional heart care, we have created designated Provider Care Teams.  These Care Teams include your primary Cardiologist (physician) and Advanced Practice Providers (APPs -  Physician Assistants and Nurse Practitioners) who all work together to provide you with the care you need, when you need it.  We recommend signing up for the patient portal called "MyChart".  Sign up information is provided on this After Visit Summary.  MyChart is used to connect with patients for Virtual Visits (Telemedicine).  Patients are able to view lab/test results, encounter notes, upcoming appointments, etc.  Non-urgent messages can be sent to your provider as well.   To learn more about what you can do with MyChart, go to [email or web address].    Your next appointment:   AS NEEDED with Dr.  Antoine Poche

## 2022-06-13 ENCOUNTER — Telehealth (HOSPITAL_COMMUNITY): Payer: Self-pay | Admitting: *Deleted

## 2022-06-13 ENCOUNTER — Encounter (HOSPITAL_COMMUNITY): Payer: Self-pay | Admitting: *Deleted

## 2022-06-13 NOTE — Telephone Encounter (Signed)
Letter sent to my chart with instructions for upcoming stress test.  Talitha Dicarlo Jacqueline  

## 2022-06-14 DIAGNOSIS — H5203 Hypermetropia, bilateral: Secondary | ICD-10-CM | POA: Diagnosis not present

## 2022-06-14 DIAGNOSIS — H52223 Regular astigmatism, bilateral: Secondary | ICD-10-CM | POA: Diagnosis not present

## 2022-06-14 DIAGNOSIS — H524 Presbyopia: Secondary | ICD-10-CM | POA: Diagnosis not present

## 2022-06-14 DIAGNOSIS — Z961 Presence of intraocular lens: Secondary | ICD-10-CM | POA: Diagnosis not present

## 2022-06-14 DIAGNOSIS — H1045 Other chronic allergic conjunctivitis: Secondary | ICD-10-CM | POA: Diagnosis not present

## 2022-06-14 DIAGNOSIS — Z135 Encounter for screening for eye and ear disorders: Secondary | ICD-10-CM | POA: Diagnosis not present

## 2022-06-18 ENCOUNTER — Ambulatory Visit (HOSPITAL_COMMUNITY): Payer: Medicare HMO

## 2022-06-20 ENCOUNTER — Ambulatory Visit (HOSPITAL_COMMUNITY): Payer: Medicare HMO | Attending: Cardiovascular Disease

## 2022-06-20 DIAGNOSIS — R079 Chest pain, unspecified: Secondary | ICD-10-CM

## 2022-06-20 LAB — EXERCISE TOLERANCE TEST
Angina Index: 0
Duke Treadmill Score: 8
Estimated workload: 10.1
Exercise duration (min): 8 min
Exercise duration (sec): 1 s
MPHR: 151 {beats}/min
Peak HR: 134 {beats}/min
Percent HR: 88 %
RPE: 17
Rest HR: 73 {beats}/min
ST Depression (mm): 0 mm

## 2022-06-25 ENCOUNTER — Ambulatory Visit: Payer: Medicare HMO | Attending: Cardiology

## 2022-06-25 ENCOUNTER — Other Ambulatory Visit: Payer: Self-pay | Admitting: *Deleted

## 2022-06-25 DIAGNOSIS — I493 Ventricular premature depolarization: Secondary | ICD-10-CM

## 2022-06-25 NOTE — Progress Notes (Unsigned)
Enrolled for [employer] to mail a ZIO XT long term holter monitor to the patients address on file.  

## 2022-06-25 NOTE — Progress Notes (Signed)
Long term monitor 

## 2022-06-28 DIAGNOSIS — I493 Ventricular premature depolarization: Secondary | ICD-10-CM | POA: Diagnosis not present

## 2022-07-04 DIAGNOSIS — E669 Obesity, unspecified: Secondary | ICD-10-CM | POA: Diagnosis not present

## 2022-07-04 DIAGNOSIS — E890 Postprocedural hypothyroidism: Secondary | ICD-10-CM | POA: Diagnosis not present

## 2022-07-04 DIAGNOSIS — L309 Dermatitis, unspecified: Secondary | ICD-10-CM | POA: Diagnosis not present

## 2022-07-04 DIAGNOSIS — I25119 Atherosclerotic heart disease of native coronary artery with unspecified angina pectoris: Secondary | ICD-10-CM | POA: Diagnosis not present

## 2022-07-04 DIAGNOSIS — J309 Allergic rhinitis, unspecified: Secondary | ICD-10-CM | POA: Diagnosis not present

## 2022-07-04 DIAGNOSIS — L400 Psoriasis vulgaris: Secondary | ICD-10-CM | POA: Diagnosis not present

## 2022-07-04 DIAGNOSIS — I129 Hypertensive chronic kidney disease with stage 1 through stage 4 chronic kidney disease, or unspecified chronic kidney disease: Secondary | ICD-10-CM | POA: Diagnosis not present

## 2022-07-04 DIAGNOSIS — M1990 Unspecified osteoarthritis, unspecified site: Secondary | ICD-10-CM | POA: Diagnosis not present

## 2022-07-04 DIAGNOSIS — E785 Hyperlipidemia, unspecified: Secondary | ICD-10-CM | POA: Diagnosis not present

## 2022-07-04 DIAGNOSIS — K219 Gastro-esophageal reflux disease without esophagitis: Secondary | ICD-10-CM | POA: Diagnosis not present

## 2022-07-06 DIAGNOSIS — I493 Ventricular premature depolarization: Secondary | ICD-10-CM | POA: Diagnosis not present

## 2022-07-16 ENCOUNTER — Telehealth: Payer: Self-pay

## 2022-07-16 ENCOUNTER — Encounter: Payer: Self-pay | Admitting: Gastroenterology

## 2022-07-16 ENCOUNTER — Ambulatory Visit (AMBULATORY_SURGERY_CENTER): Payer: Medicare HMO

## 2022-07-16 VITALS — Ht 69.0 in | Wt 205.0 lb

## 2022-07-16 DIAGNOSIS — R1310 Dysphagia, unspecified: Secondary | ICD-10-CM

## 2022-07-16 DIAGNOSIS — Z86010 Personal history of colonic polyps: Secondary | ICD-10-CM

## 2022-07-16 MED ORDER — PEG 3350-KCL-NA BICARB-NACL 420 G PO SOLR: 4000.0000 mL | Freq: Once | ORAL | 0 refills | Status: AC

## 2022-07-16 NOTE — Progress Notes (Signed)
No egg or soy allergy known to patient  No issues known to pt with past sedation with any surgeries or procedures - PONV  Patient denies ever being told they had issues or difficulty with intubation  No FH of Malignant Hyperthermia Pt is not on diet pills Pt is not on  home 02  Pt is not on blood thinners  Pt denies issues with constipation  No A fib or A flutter Have any cardiac testing pending-- no  LOA: independent  Prep: Golytely   Patient's chart reviewed by Cathlyn Parsons CNRA prior to previsit and patient appropriate for the [facility].  Previsit completed and red dot placed by patient's name on their procedure day (on provider's schedule).     PV competed with patient. Prep instructions sent via mychart and home address.

## 2022-07-16 NOTE — Telephone Encounter (Signed)
Zharia, Thanks for finding this. Now the only question is Plavix and ability to hold this. If it can be held for 5-days (pending the previous conversation) then he can be scheduled direct procedure and does not need to be seen in clinic. Thanks. GM

## 2022-07-16 NOTE — Telephone Encounter (Signed)
Dr. Meridee Score,   I am completing Keith Thompson PV. He made me aware he has been started on Plavix. Patient has not been seen in office since 2020. Please advise if he is going to need a OV prior to his procedure or if you're ok with [place] just getting clearance.   Thank you

## 2022-07-16 NOTE — Telephone Encounter (Signed)
Dr. Meridee Score it looks like the stress test was completed on 06/20/22. He also completed the zio monitor study and results I see on 06/25/22. Please review and advise.

## 2022-07-17 NOTE — Telephone Encounter (Addendum)
Defer Decision on holding Plavix to Cardiology

## 2022-07-17 NOTE — Telephone Encounter (Signed)
[facility] Pre-operative Risk Assessment     Request for surgical clearance:     Endoscopy Procedure  What type of surgery is being performed?     Endo Colon    When is this surgery scheduled?     08/14/22  What type of clearance is required ?   Pharmacy  Are there any medications that need to be held prior to surgery and how long? Plavix   Practice name and name of physician performing surgery?      [facility]  What is your office phone and fax number?      Phone- [phone]  Fax- [phone]  Anesthesia type (None, local, MAC, general) ?       MAC

## 2022-07-17 NOTE — Telephone Encounter (Signed)
Hey Patty,   Can you please obtain clearance from cardiology for this patients Plavix   Thank you,  Previsit

## 2022-07-17 NOTE — Telephone Encounter (Signed)
   Patient Name: Keith Thompson  DOB: 1955-05-17 MRN: [id number]  Primary Cardiologist: None  Chart reviewed as part of pre-operative protocol coverage.   Please advise that guidance for holding Plavix and aspirin should come from prescribing provider.  These medications are not managed by cardiology and appear to be managed by neurology.  Please let me know if you have any additional questions.   Napoleon Form, Leodis Rains, NP 07/17/2022, 4:43 PM

## 2022-07-18 NOTE — Telephone Encounter (Signed)
Patty, Please forward a preoperative risk assessment and antiplatelet hold to our neurology colleagues. Thanks. GM

## 2022-07-18 NOTE — Telephone Encounter (Signed)
Anti coag letter sent to Sande Rives MD at Neurology

## 2022-07-23 ENCOUNTER — Encounter: Payer: Self-pay | Admitting: Family Medicine

## 2022-07-23 ENCOUNTER — Ambulatory Visit: Payer: Medicare HMO | Admitting: Family Medicine

## 2022-07-23 VITALS — BP 131/88 | HR 57 | Ht 69.5 in | Wt 209.8 lb

## 2022-07-23 DIAGNOSIS — R519 Headache, unspecified: Secondary | ICD-10-CM | POA: Diagnosis not present

## 2022-07-23 DIAGNOSIS — M7918 Myalgia, other site: Secondary | ICD-10-CM | POA: Diagnosis not present
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
## 2022-07-23 NOTE — Patient Instructions (Addendum)
Below is our plan:  We will continue to monitor headaches and neck pain. Consider using Voltaren gel as needed. Massage, heat, ice, TENS units, etc. Can all be helpful. Continue rosuvastatin, Plavix and aspirin for stroke prevention.   Please make sure you are staying well hydrated. I recommend 50-60 ounces daily. Well balanced diet and regular exercise encouraged. Consistent sleep schedule with 6-8 hours recommended.   Please continue follow up with care team as directed.   Follow up with me in as needed   You may receive a survey regarding today's visit. I encourage you to leave honest feed back as I do use this information to improve patient care. Thank you for seeing me today!

## 2022-07-25 NOTE — Telephone Encounter (Signed)
I hate to kick the can down the road, but I saw him for headache and neck pain and  Dr. Lenell Antu in vascular saw him for his occluded carotid artery and Dr. Verita Lamb last note said asa 81mg  only.   Looks like the plavix was prescribed by Dr. Claiborne Billings on 02/21/2022, I read through her note and it seems she started the plavix for DAPT for 90days along with baby aspirin. Not sure why it continues.  I think this is a question for Dr. Lenell Antu and Dr. Claiborne Billings on what they want to do for his occluded carotid artery. I'm not a stoke specialist but I would think we would not cpntinue DUAP but keep him on either asa or plavix. And whether to stop either for a procedure would be best answered by Dr. Lenell Antu.  Sorry. Let me know if you think otherwise. Dr. Pearlean Brownie isn;t in the office this week but when he gets back I can ask him if needed, happy to do so.  Thanks Dr. Lucia Gaskins Sheralyn Boatman)

## 2022-07-27 NOTE — Telephone Encounter (Signed)
Leonie Douglas, MD  Anson Fret, MD; Mansouraty, Netty Starring., MD; Loretha Stapler, RN; Claiborne Billings, Renee A, DO2 hours ago (7:59 AM)   [facility] Yes that is fine with me. From my standpoint, he can be on monotherapy (either ASA or Plavix) post op. DAPT does theoretically lower his risk of stroke, but there is no robust evidence to support this for asymptomatic carotid occlusion.    Anson Fret, MD  Leonie Douglas, MD; Mansouraty, Netty Starring., MD; Loretha Stapler, RN; Natalia Leatherwood, DOYesterday (9:46 AM)    Dr. Lenell Antu thank you! is it ok to hold plavix for 5 days and keep him just on ASA for his surgery given his carotid occlusion? I wold think so but again since I just treated his headache I want to make sure you are ok with that. There is an increased risk of stroke but it is minimal, still he should be informed and consented on this risk and that should be documented by his surgeon. After the procedure do you want him on ASA and plavix long term or just ASA for his occluded carotid? I think we would pick just one but again I am not a vascular expert or stroke physician so we thank you for your help !!!    Leonie Douglas, MD  Anson Fret, MD; Mansouraty, Netty Starring., MD; Loretha Stapler, RN; Natalia Leatherwood, DOYesterday (6:53 AM)   TH DAPT OK by me as long as he doesn't have any trouble with bleeding.

## 2022-07-27 NOTE — Telephone Encounter (Signed)
Thanks to everyone for response. It looks like we can hold the Plavix for at least 5 days.  Aspirin can be continued.  What ever you all want after the procedure just let me know because I can certainly put him back on DAPT or keep him on monotherapy thereafter. Thanks. GM

## 2022-07-27 NOTE — Telephone Encounter (Signed)
Left message for patient to call back to discuss Plavix hold

## 2022-07-30 NOTE — Telephone Encounter (Signed)
The pt has been advised that he should hold plavix 5 days prior to his procedure. No further questions or concerns at that time

## 2022-07-30 NOTE — Telephone Encounter (Signed)
Left message on machine to call back  

## 2022-07-31 ENCOUNTER — Telehealth: Payer: Self-pay

## 2022-07-31 ENCOUNTER — Ambulatory Visit: Payer: Medicare HMO | Admitting: Family Medicine

## 2022-07-31 NOTE — Telephone Encounter (Signed)
Pt came in office stating he was told to come in for labs prior to the appt. No mention of this in pt's chart.

## 2022-08-01 NOTE — Telephone Encounter (Signed)
 Thanks Patty. GM 

## 2022-08-06 ENCOUNTER — Encounter: Payer: Self-pay | Admitting: Family Medicine

## 2022-08-06 ENCOUNTER — Ambulatory Visit (INDEPENDENT_AMBULATORY_CARE_PROVIDER_SITE_OTHER): Payer: Medicare HMO | Admitting: Family Medicine

## 2022-08-06 VITALS — BP 114/74 | HR 66 | Temp 97.5°F | Ht 70.0 in | Wt 207.4 lb

## 2022-08-06 DIAGNOSIS — Z125 Encounter for screening for malignant neoplasm of prostate: Secondary | ICD-10-CM | POA: Diagnosis not present

## 2022-08-06 DIAGNOSIS — Z0000 Encounter for general adult medical examination without abnormal findings: Secondary | ICD-10-CM

## 2022-08-06 DIAGNOSIS — Z5181 Encounter for therapeutic drug level monitoring: Secondary | ICD-10-CM

## 2022-08-06 DIAGNOSIS — Z86010 Personal history of colonic polyps: Secondary | ICD-10-CM

## 2022-08-06 DIAGNOSIS — E890 Postprocedural hypothyroidism: Secondary | ICD-10-CM | POA: Diagnosis not present

## 2022-08-06 DIAGNOSIS — I10 Essential (primary) hypertension: Secondary | ICD-10-CM

## 2022-08-06 DIAGNOSIS — E559 Vitamin D deficiency, unspecified: Secondary | ICD-10-CM | POA: Diagnosis not present

## 2022-08-06 DIAGNOSIS — Z131 Encounter for screening for diabetes mellitus: Secondary | ICD-10-CM

## 2022-08-06 DIAGNOSIS — E785 Hyperlipidemia, unspecified: Secondary | ICD-10-CM | POA: Diagnosis not present

## 2022-08-06 DIAGNOSIS — Z79899 Other long term (current) drug therapy: Secondary | ICD-10-CM | POA: Diagnosis not present

## 2022-08-06 LAB — LIPID PANEL
Cholesterol: 137 mg/dL (ref 0–200)
HDL: 51.6 mg/dL (ref >39.00)
LDL Cholesterol: 64 mg/dL (ref 0–99)
NonHDL: 85.74
Total CHOL/HDL Ratio: 3
Triglycerides: 107 mg/dL (ref 0.0–149.0)
VLDL: 21.4 mg/dL (ref 0.0–40.0)

## 2022-08-06 LAB — CBC WITH DIFFERENTIAL/PLATELET
Basophils Absolute: 0.1 10*3/uL (ref 0.0–0.1)
Basophils Relative: 1 % (ref 0.0–3.0)
Eosinophils Absolute: 0.1 10*3/uL (ref 0.0–0.7)
Eosinophils Relative: 1.6 % (ref 0.0–5.0)
HCT: 49.9 % (ref 39.0–52.0)
Hemoglobin: 16.1 g/dL (ref 13.0–17.0)
Lymphocytes Relative: 39 % (ref 12.0–46.0)
Lymphs Abs: 2.6 10*3/uL (ref 0.7–4.0)
MCHC: 32.3 g/dL (ref 30.0–36.0)
MCV: 99.8 fl (ref 78.0–100.0)
Monocytes Absolute: 1 10*3/uL (ref 0.1–1.0)
Monocytes Relative: 14.2 % — ABNORMAL HIGH (ref 3.0–12.0)
Neutro Abs: 3 10*3/uL (ref 1.4–7.7)
Neutrophils Relative %: 44.2 % (ref 43.0–77.0)
Platelets: 217 10*3/uL (ref 150.0–400.0)
RBC: 5 Mil/uL (ref 4.22–5.81)
RDW: 14 % (ref 11.5–15.5)
WBC: 6.8 10*3/uL (ref 4.0–10.5)

## 2022-08-06 LAB — COMPREHENSIVE METABOLIC PANEL
ALT: 18 U/L (ref 0–53)
AST: 18 U/L (ref 0–37)
Albumin: 4.5 g/dL (ref 3.5–5.2)
Alkaline Phosphatase: 60 U/L (ref 39–117)
BUN: 18 mg/dL (ref 6–23)
CO2: 28 mEq/L (ref 19–32)
Calcium: 9.9 mg/dL (ref 8.4–10.5)
Chloride: 102 mEq/L (ref 96–112)
Creatinine, Ser: 0.91 mg/dL (ref 0.40–1.50)
GFR: 86.08 mL/min (ref >60.00)
Glucose, Bld: 96 mg/dL (ref 70–99)
Potassium: 4.9 mEq/L (ref 3.5–5.1)
Sodium: 140 mEq/L (ref 135–145)
Total Bilirubin: 0.7 mg/dL (ref 0.2–1.2)
Total Protein: 7.1 g/dL (ref 6.0–8.3)

## 2022-08-06 LAB — PSA: PSA: 1.6 ng/mL (ref 0.10–4.00)

## 2022-08-06 LAB — TSH: TSH: 1.78 u[IU]/mL (ref 0.35–5.50)

## 2022-08-06 LAB — HEMOGLOBIN A1C: Hgb A1c MFr Bld: 6.1 % (ref 4.6–6.5)

## 2022-08-06 MED ORDER — VALSARTAN 40 MG PO TABS: 60.0000 mg | ORAL_TABLET | Freq: Every day | ORAL | 1 refills | Status: DC
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
## 2022-08-06 NOTE — Patient Instructions (Addendum)
Return in about 24 weeks (around 01/21/2023) for Routine chronic condition follow-up.        Great to see you today.  I have refilled the medication(s) we provide.   If labs were collected, we will inform you of lab results once received either by echart message or telephone call.   - echart message- for normal results that have been seen by the patient already.   - telephone call: abnormal results or if patient has not viewed results in their echart.

## 2022-08-14 ENCOUNTER — Encounter: Payer: Self-pay | Admitting: Gastroenterology

## 2022-08-14 ENCOUNTER — Other Ambulatory Visit: Payer: Self-pay

## 2022-08-14 ENCOUNTER — Ambulatory Visit (AMBULATORY_SURGERY_CENTER): Payer: Medicare HMO | Admitting: Gastroenterology

## 2022-08-14 VITALS — BP 136/75 | HR 63 | Temp 97.8°F | Resp 17 | Ht 69.0 in | Wt 205.0 lb

## 2022-08-14 DIAGNOSIS — Z09 Encounter for follow-up examination after completed treatment for conditions other than malignant neoplasm: Secondary | ICD-10-CM | POA: Diagnosis not present

## 2022-08-14 DIAGNOSIS — R1310 Dysphagia, unspecified: Secondary | ICD-10-CM | POA: Diagnosis not present

## 2022-08-14 DIAGNOSIS — D122 Benign neoplasm of ascending colon: Secondary | ICD-10-CM

## 2022-08-14 DIAGNOSIS — Z86010 Personal history of colonic polyps: Secondary | ICD-10-CM

## 2022-08-14 DIAGNOSIS — I10 Essential (primary) hypertension: Secondary | ICD-10-CM | POA: Diagnosis not present

## 2022-08-14 DIAGNOSIS — K635 Polyp of colon: Secondary | ICD-10-CM | POA: Diagnosis not present

## 2022-08-14 DIAGNOSIS — K2270 Barrett's esophagus without dysplasia: Secondary | ICD-10-CM

## 2022-08-14 DIAGNOSIS — E785 Hyperlipidemia, unspecified: Secondary | ICD-10-CM | POA: Diagnosis not present

## 2022-08-14 MED ORDER — SODIUM CHLORIDE 0.9 % IV SOLN: 500.0000 mL | INTRAVENOUS | Status: DC

## 2022-08-14 NOTE — Progress Notes (Signed)
Pt's states no medical or surgical changes since previsit or office visit. 

## 2022-08-14 NOTE — Progress Notes (Signed)
Called to room to assist during endoscopic procedure.  Patient ID and intended procedure confirmed with present staff. Received instructions for my participation in the procedure from the performing physician.  

## 2022-08-14 NOTE — Op Note (Signed)
[facility] Patient Name: Keith Thompson Procedure Date: 08/14/2022 10:33 AM MRN: [id number] Endoscopist: Corliss Parish , MD, [id number] Age: 67 Referring MD:  Date of Birth: 30-Sep-1955 Gender: Male Account #: [id number] Procedure:                Upper GI endoscopy Indications:              Surveillance for malignancy due to personal history                            of Barrett's esophagus, Dysphagia Medicines:                Monitored Anesthesia Care Procedure:                Pre-Anesthesia Assessment:                           - Prior to the procedure, a History and Physical                            was performed, and patient medications and                            allergies were reviewed. The patient's tolerance of                            previous anesthesia was also reviewed. The risks                            and benefits of the procedure and the sedation                            options and risks were discussed with the patient.                            All questions were answered, and informed consent                            was obtained. Prior Anticoagulants: The patient                            last took Plavix (clopidogrel) 5 days prior to the                            procedure and has taken no anticoagulant or                            antiplatelet agents except for aspirin. ASA Grade                            Assessment: III - A patient with severe systemic                            disease. After reviewing the risks and benefits,  the patient was deemed in satisfactory condition to                            undergo the procedure.                           After obtaining informed consent, the endoscope was                            passed under direct vision. Throughout the                            procedure, the patient's blood pressure, pulse, and                            oxygen saturations were  monitored continuously. The                            GIF [id number] #[id number] was introduced through the                            mouth, and advanced to the second part of duodenum.                            The upper GI endoscopy was accomplished without                            difficulty. The patient tolerated the procedure. Scope In: Scope Out: Findings:                 The examined esophagus was moderately tortuous.                           No gross mucosal lesions were noted in the proximal                            esophagus and in the mid esophagus.                           The esophagus and gastroesophageal junction were                            examined with white light and narrow band imaging                            (NBI) from a forward view and retroflexed position.                            There were esophageal mucosal changes consistent                            with long-segment Barrett's esophagus. These                            changes involved  the mucosa at the upper extent of                            the gastric folds (37 cm from the incisors)                            extending to the Z-line (34 cm from the incisors)                            and then upwards more proximally. Circumferential                            salmon-colored mucosa was present from 35 to 37 cm                            and three tongues of salmon-colored mucosa were                            present from 32 to 34 cm. Mucosa was biopsied with                            a cold forceps for histology in 4 quadrants at                            intervals of 2 cm from 32 to 37 cm from the                            incisors. A total of 3 specimen bottles were sent                            to pathology.                           After the rest of the EGD was completed, a                            guidewire was placed and the scope was withdrawn.                             Dilation was performed in the esophagus with a                            Savary dilator with moderate resistance at 16 mm.                            The dilation site was examined following endoscope                            reinsertion and showed mild mucosal disruption just                            below the UES, mild improvement in luminal  narrowing and no perforation.                           A 3 cm hiatal hernia was present.                           Multiple small semi-sessile polyps were found in                            the cardia and in the gastric fundus?"fundic gland                            in appearance.                           Patchy mildly erythematous mucosa without bleeding                            was found in the entire examined stomach. Biopsies                            were taken with a cold forceps for histology and                            Helicobacter pylori testing.                           No gross lesions were noted in the duodenal bulb,                            in the first portion of the duodenum and in the                            second portion of the duodenum. Complications:            No immediate complications. Estimated Blood Loss:     Estimated blood loss was minimal. Impression:               - Tortuous esophagus.                           - No gross mucosal lesions in the proximal                            esophagus and in the mid esophagus.                           - Esophageal mucosal changes consistent with                            long-segment Barrett's esophagus. Biopsied as noted                            above.                           -  Dilation performed in the esophagus. Mucosal                            wrent just below the UES.                           - 3 cm hiatal hernia.                           - Multiple gastric polyps?"fundic gland in                             appearance.                           - Erythematous mucosa in the stomach. Biopsied.                           - No gross lesions in the duodenal bulb, in the                            first portion of the duodenum and in the second                            portion of the duodenum. Recommendation:           - Proceed to scheduled colonoscopy.                           - Continue current PPI dosing.                           - Observe patient's clinical course.                           - Dilation diet as per protocol.                           - Please use Cepacol or Halls Lozenges +/-                            Chloraseptic spray for next 72-96 hours to aid in                            sore thoat should you experience this.                           - Await pathology results.                           - Repeat upper endoscopy for dysphagia and repeat                            dilation if necessary as needed. Repeat EGD for  Barrett's surveillance based on final pathology                            likely 3 years if no dysplastic changes found.                           - The findings and recommendations were discussed                            with the patient.                           - The findings and recommendations were discussed                            with the designated responsible adult. Corliss Parish, MD 08/14/2022 11:22:23 AM

## 2022-08-14 NOTE — Op Note (Signed)
[facility] Patient Name: Keith Thompson Procedure Date: 08/14/2022 10:33 AM MRN: [id number] Endoscopist: Corliss Parish , MD, [id number] Age: 67 Referring MD:  Date of Birth: Jan 24, 1955 Gender: Male Account #: [id number] Procedure:                Colonoscopy Indications:              Surveillance: Personal history of adenomatous                            polyps on last colonoscopy 5 years ago Medicines:                Monitored Anesthesia Care Procedure:                Pre-Anesthesia Assessment:                           - Prior to the procedure, a History and Physical                            was performed, and patient medications and                            allergies were reviewed. The patient's tolerance of                            previous anesthesia was also reviewed. The risks                            and benefits of the procedure and the sedation                            options and risks were discussed with the patient.                            All questions were answered, and informed consent                            was obtained. Prior Anticoagulants: The patient                            last took Plavix (clopidogrel) 5 days prior to the                            procedure and has taken no anticoagulant or                            antiplatelet agents except for aspirin. ASA Grade                            Assessment: III - A patient with severe systemic                            disease. After reviewing the risks and benefits,  the patient was deemed in satisfactory condition to                            undergo the procedure.                           After obtaining informed consent, the colonoscope                            was passed under direct vision. Throughout the                            procedure, the patient's blood pressure, pulse, and                            oxygen saturations were monitored  continuously. The                            CF [id number] #[id number] was introduced through the anus                            and advanced to the 3 cm into the ileum. The                            colonoscopy was performed without difficulty. The                            patient tolerated the procedure. The quality of the                            bowel preparation was adequate. The terminal ileum,                            ileocecal valve, appendiceal orifice, and rectum                            were photographed. Scope In: 10:56:52 AM Scope Out: 11:10:02 AM Scope Withdrawal Time: 0 hours 11 minutes 22 seconds  Total Procedure Duration: 0 hours 13 minutes 10 seconds  Findings:                 The digital rectal exam findings include                            hemorrhoids. Pertinent negatives include no                            palpable rectal lesions.                           The terminal ileum and ileocecal valve appeared                            normal.  A 2 mm polyp was found in the ascending colon. The                            polyp was sessile. The polyp was removed with a                            cold snare. Resection and retrieval were complete.                           Multiple small-mouthed diverticula were found in                            the recto-sigmoid colon and sigmoid colon.                           Normal mucosa was found in the entire colon                            otherwise.                           Non-bleeding non-thrombosed external and internal                            hemorrhoids were found during retroflexion, during                            perianal exam and during digital exam. The                            hemorrhoids were Grade II (internal hemorrhoids                            that prolapse but reduce spontaneously). Complications:            No immediate complications. Estimated Blood Loss:     Estimated  blood loss: none. Estimated blood loss                            was minimal. Impression:               - Hemorrhoids found on digital rectal exam.                           - The examined portion of the ileum was normal.                           - One 2 mm polyp in the ascending colon, removed                            with a cold snare. Resected and retrieved.                           - Diverticulosis in the recto-sigmoid colon and in  the sigmoid colon.                           - Normal mucosa in the entire examined colon                            otherwise.                           - Non-bleeding non-thrombosed external and internal                            hemorrhoids. Recommendation:           - The patient will be observed post-procedure,                            until all discharge criteria are met.                           - Discharge patient to home.                           - Patient has a contact number available for                            emergencies. The signs and symptoms of potential                            delayed complications were discussed with the                            patient. Return to normal activities tomorrow.                            Written discharge instructions were provided to the                            patient.                           - High fiber diet.                           - May restart Plavix on 8/1 to decrease post                            interventional bleeding risk.                           - Continue present medications.                           - Await pathology results.                           - Repeat colonoscopy in 5-7 years for surveillance  based on pathology and previous history of                            adenomatous colon polyps.                           - The findings and recommendations were discussed                            with the  patient.                           - The findings and recommendations were discussed                            with the designated responsible adult. Corliss Parish, MD 08/14/2022 11:26:31 AM

## 2022-08-14 NOTE — Progress Notes (Signed)
Sedate, gd SR, tolerated procedure well, VSS, report to RN 

## 2022-08-14 NOTE — Patient Instructions (Signed)
Continue current PPI doseing, Follow post dilation diet today. Please use Cepacol or Halls Lozenges/Chloraseptic spray for the next 72-96 hours to aid in sore throat should you experience this. Repeat EGD for Barrett's Surveillance likely in 3 years based on pathology results. Follow a High fiber diet. May restart Plavix on 8/1 to decrease post interventional bleeding risk. Likely repeat Colonoscopy in 5-7 years based on pathology results.  Handouts provided on: Colon polyps, Diverticulosis and Hemorrhoids. Post dilation diet and Hiatal Hernia.  YOU HAD AN ENDOSCOPIC PROCEDURE TODAY AT THE [facility]:   Refer to the procedure report that was given to you for any specific questions about what was found during the examination.  If the procedure report does not answer your questions, please call your gastroenterologist to clarify.  If you requested that your care partner not be given the details of your procedure findings, then the procedure report has been included in a sealed envelope for you to review at your convenience later.  YOU SHOULD EXPECT: Some feelings of bloating in the abdomen. Passage of more gas than usual.  Walking can help get rid of the air that was put into your GI tract during the procedure and reduce the bloating. If you had a lower endoscopy (such as a colonoscopy or flexible sigmoidoscopy) you may notice spotting of blood in your stool or on the toilet paper. If you underwent a bowel prep for your procedure, you may not have a normal bowel movement for a few days.  Please Note:  You might notice some irritation and congestion in your nose or some drainage.  This is from the oxygen used during your procedure.  There is no need for concern and it should clear up in a day or so.  SYMPTOMS TO REPORT IMMEDIATELY:  Following lower endoscopy (colonoscopy or flexible sigmoidoscopy):  Excessive amounts of blood in the stool  Significant tenderness or worsening of abdominal  pains  Swelling of the abdomen that is new, acute  Fever of 100F or higher  Following upper endoscopy (EGD)  Vomiting of blood or coffee ground material  New chest pain or pain under the shoulder blades  Painful or persistently difficult swallowing  New shortness of breath  Fever of 100F or higher  Black, tarry-looking stools  For urgent or emergent issues, a gastroenterologist can be reached at any hour by calling [phone]. Do not use MyChart messaging for urgent concerns.    DIET:  We do recommend a small meal at first, but then you may proceed to your regular diet.  Drink plenty of fluids but you should avoid alcoholic beverages for 24 hours.  ACTIVITY:  You should plan to take it easy for the rest of today and you should NOT DRIVE or use heavy machinery until tomorrow (because of the sedation medicines used during the test).    FOLLOW UP: Our staff will call the number listed on your records the next business day following your procedure.  We will call around 7:15- 8:00 am to check on you and address any questions or concerns that you may have regarding the information given to you following your procedure. If we do not reach you, we will leave a message.     If any biopsies were taken you will be contacted by phone or by letter within the next 1-3 weeks.  Please call us at [phone] if you have not heard about the biopsies in 3 weeks.    SIGNATURES/CONFIDENTIALITY: You and/or  your care partner have signed paperwork which will be entered into your electronic medical record.  These signatures attest to the fact that that the information above on your After Visit Summary has been reviewed and is understood.  Full responsibility of the confidentiality of this discharge information lies with you and/or your care-partner.
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
## 2022-08-15 ENCOUNTER — Telehealth: Payer: Self-pay

## 2022-08-15 NOTE — Telephone Encounter (Signed)
  Follow up Call-     08/14/2022    9:58 AM  Call back number  Post procedure Call Back phone  # [phone]  Permission to leave phone message Yes     Patient questions:  Do you have a fever, pain , or abdominal swelling? No. Pain Score  0 *  Have you tolerated food without any problems? Yes.    Have you been able to return to your normal activities? Yes.    Do you have any questions about your discharge instructions: Diet   No. Medications  No. Follow up visit  No.  Do you have questions or concerns about your Care? No.  Actions: * If pain score is 4 or above: No action needed, pain <4.

## 2022-08-17 ENCOUNTER — Encounter: Payer: Self-pay | Admitting: Gastroenterology

## 2022-09-18 ENCOUNTER — Telehealth: Payer: Self-pay

## 2022-09-18 NOTE — Telephone Encounter (Signed)
LM for pt to return call to discuss.   Per office note on 04/11/22: Continue clopidogrel (PLAVIX) 75 MG tablet- short term 90d for DAPT   Pt is not supposed to be taking at this time

## 2022-09-18 NOTE — Telephone Encounter (Signed)
Patient states that his pharmacy has been reaching out to our office regarding refill on clopidogrel (PLAVIX) 75 MG tablet .  He states that Dr. Alan Ripper name is on bottle.  Patient is wondering if he needs to take this?  Please call [phone]

## 2022-09-18 NOTE — Telephone Encounter (Signed)
Spoke with patient regarding results/recommendations.  

## 2022-09-23 DIAGNOSIS — R079 Chest pain, unspecified: Secondary | ICD-10-CM | POA: Insufficient documentation

## 2022-09-23 DIAGNOSIS — I739 Peripheral vascular disease, unspecified: Secondary | ICD-10-CM | POA: Insufficient documentation

## 2022-09-23 DIAGNOSIS — E118 Type 2 diabetes mellitus with unspecified complications: Secondary | ICD-10-CM | POA: Insufficient documentation

## 2022-09-23 HISTORY — DX: Chest pain, unspecified: R07.9

## 2022-09-23 NOTE — Progress Notes (Unsigned)
Cardiology Office Note:   Date:  09/23/2022  ID:  Keith Thompson, DOB 1955-06-14, MRN [id number] PCP: Natalia Leatherwood, DO  [facility] Providers Cardiologist:  None {  History of Present Illness:   Keith Thompson is a 67 y.o. male who is referred for evaluation of dyslipidemia, PAD and chest pain.  He had a POET (Plain Old Exercise Treadmill) without ischemia but with PVCs.  He wore a Zio .  He had NSR with PACs and not frequent PVCs.    ***      ***The rhythm was normal sinus.  He had very infrequent runs of supraventricular tachycardia but there were no significant burden of ventricular arrhythmias and not a lot of PVCs.  He had 1 slight pause but this was during sleeping hours.  There were no sustained arrhythmias that require any change in therapy.   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  This was obtained to daily some neurologic complaints.  He also had some chest pain which was fleeting left-sided discomfort.  He had a CT of his chest.  This ruled out a pulmonary embolism but did identify some aortic atherosclerosis and coronary artery plaque.  He not had any prior cardiovascular testing.  He said the pain that he had was sharp.  It was fleeting.  It happened 1 day and has not returned.  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  He might get a little short of breath carrying a leaf blower up an incline but he is not describing substernal chest discomfort, neck or arm discomfort.  He has not had any new shortness of breath, PND or orthopnea.  He denies any palpitations, presyncope or syncope.  He has had no weight gain or edema.    ROS: ***  Studies Reviewed:    EKG:       ***  Risk Assessment/Calculations:   {Does this patient have ATRIAL FIBRILLATION?:[phone]} No BP recorded.  {Refresh Note OR Click here to enter BP  :1}***        Physical Exam:   VS:  There  were no vitals taken for this visit.   Wt Readings from Last 3 Encounters:  08/14/22 205 lb (93 kg)  08/06/22 207 lb 6.4 oz (94.1 kg)  07/23/22 209 lb 12.8 oz (95.2 kg)     GEN: Well nourished, well developed in no acute distress NECK: No JVD; No carotid bruits CARDIAC: ***RRR, no murmurs, rubs, gallops RESPIRATORY:  Clear to auscultation without rales, wheezing or rhonchi  ABDOMEN: Soft, non-tender, non-distended EXTREMITIES:  No edema; No deformity   ASSESSMENT AND PLAN:   DYSLIPIDEMIA: ***  I think the goal LDL should be in the 50s.  His was 72 some but increase her Crestor to 40 mg daily check a lipid profile in 3 to 4 months.   CHEST PAIN: ***  He had 1 episode of atypical chest pain and otherwise has not had any.  He has a vigorous job.  He does have coronary calcium. I will bring the patient back for a POET (Plain Old Exercise Test). This will allow me to screen for obstructive coronary disease, risk stratify and very importantly provide a prescription for exercise.   Hypertension: His blood pressure is ***  was repeated and it was 120s over 70s in both arms.  He says it runs  very low at home sometimes.  No change in therapy.   PAD:   ***  He was seen by vascular surgery and is being managed with risk reduction.   IRBBB:  ***  No change in therapy.  This is unchanged from distant EKGs.    {Are you ordering a CV Procedure (e.g. stress test, cath, DCCV, TEE, etc)?   Press F2        :[id number]}  Follow up ***  Signed, Rollene Rotunda, MD

## 2022-09-23 NOTE — H&P (View-Only) (Signed)
Cardiology Office Note:   Date:  09/26/2022  ID:  Keith Thompson, DOB 03/04/1955, MRN [id number] PCP: Natalia Leatherwood, DO  [facility] Providers Cardiologist:  None {  History of Present Illness:   Keith Thompson is a 67 y.o. male who is referred for evaluation of dyslipidemia, PAD and chest pain.  He had a POET (Plain Old Exercise Treadmill) without ischemia but with PVCs.  He wore a Zio .  He had NSR with PACs and not frequent PVCs.    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    He has been noted to have coronary calcium before on his CT done to rule out pulmonary embolism.  He has aortic atherosclerosis.  He is continuing to get chest discomfort for which she had his treadmill test.  This is left-sided.  Sporadic.  There is no jaw discomfort.  It does come on occasionally with exertion.  Was most concerning is that he is getting more short of breath walking up an incline.  This has been progressive.  He works outside [employer].  He is not really noticing his PVCs and has had no palpitations, presyncope or syncope.  ROS: As stated in the HPI and negative for all other systems.  Studies Reviewed:    EKG:   NA   Risk Assessment/Calculations:              Physical Exam:   VS:  BP 108/80   Pulse 76   Ht 5' 9.5" (1.765 m)   Wt 209 lb (94.8 kg)   BMI 30.42 kg/m    Wt Readings from Last 3 Encounters:  09/26/22 209 lb (94.8 kg)  08/14/22 205 lb (93 kg)  08/06/22 207 lb 6.4 oz (94.1 kg)     GEN: Well nourished, well developed in no acute distress NECK: No JVD; No carotid bruits CARDIAC: RRR, no murmurs, rubs, gallops RESPIRATORY:  Clear to auscultation without rales, wheezing or rhonchi  ABDOMEN: Soft, non-tender, non-distended EXTREMITIES:  No edema; No deformity   ASSESSMENT AND PLAN:   DYSLIPIDEMIA: LDL is 64 with an HDL of 51.  No change in therapy.   CHEST PAIN/SOB: Because of his ongoing chest  pain and mostly because he is now developed increasing exertional shortness of breath which could represent unstable angina he is going to get coronary CTA.    Hypertension: His blood pressure is at target.  No change in therapy.    PAD:   He is followed by vascular surgery.  We are pursuing risk reduction.  IRBBB: He has had no symptomatic bradycardia arrhythmias.  No change in therapy.  PVCs: I will complete the ischemia workup.  He is not really having any symptoms related to this.  No change in therapy.       Follow up with me in 12 months.   Signed, Rollene Rotunda, MD

## 2022-09-26 ENCOUNTER — Other Ambulatory Visit: Payer: Self-pay | Admitting: *Deleted

## 2022-09-26 ENCOUNTER — Encounter: Payer: Self-pay | Admitting: Cardiology

## 2022-09-26 ENCOUNTER — Ambulatory Visit: Payer: Medicare HMO | Admitting: Cardiology

## 2022-09-26 VITALS — BP 108/80 | HR 76 | Ht 69.5 in | Wt 209.0 lb

## 2022-09-26 DIAGNOSIS — I739 Peripheral vascular disease, unspecified: Secondary | ICD-10-CM | POA: Diagnosis not present

## 2022-09-26 DIAGNOSIS — E118 Type 2 diabetes mellitus with unspecified complications: Secondary | ICD-10-CM | POA: Diagnosis not present

## 2022-09-26 DIAGNOSIS — E785 Hyperlipidemia, unspecified: Secondary | ICD-10-CM | POA: Diagnosis not present

## 2022-09-26 DIAGNOSIS — R0602 Shortness of breath: Secondary | ICD-10-CM | POA: Diagnosis not present

## 2022-09-26 DIAGNOSIS — I10 Essential (primary) hypertension: Secondary | ICD-10-CM | POA: Diagnosis not present

## 2022-09-26 DIAGNOSIS — Z01812 Encounter for preprocedural laboratory examination: Secondary | ICD-10-CM

## 2022-09-26 DIAGNOSIS — R079 Chest pain, unspecified: Secondary | ICD-10-CM

## 2022-09-26 MED ORDER — METOPROLOL TARTRATE 50 MG PO TABS: 50.0000 mg | ORAL_TABLET | ORAL | 0 refills | Status: DC

## 2022-09-26 NOTE — Patient Instructions (Signed)
Medication Instructions:  The current medical regimen is effective;  continue present plan and medications.  *If you need a refill on your cardiac medications before your next appointment, please call your pharmacy*   Lab Work: Please have blood work at your closest [employer]. [facility])  If you have labs (blood work) drawn today and your tests are completely normal, you will receive your results only by: MyChart Message (if you have MyChart) OR A paper copy in the mail If you have any lab test that is abnormal or we need to change your treatment, we will call you to review the results.   Testing/Procedures:   Your cardiac CT will be scheduled at:   [facility] [address] [place], [place] [address] [phone]  Please arrive at the [facility] and Children's Entrance (Entrance C2) of [facility] 30 minutes prior to test start time. You can use the FREE valet parking offered at entrance C (encouraged to control the heart rate for the test)  Proceed to the [facility] Radiology Department (first floor) to check-in and test prep.  All radiology patients and guests should use entrance C2 at [facility], accessed from [facility], even though the hospital's physical address listed is [address].     Please follow these instructions carefully (unless otherwise directed):  An IV will be required for this test and Nitroglycerin will be given.  Hold all erectile dysfunction medications at least 3 days (72 hrs) prior to test. (Ie viagra, cialis, sildenafil, tadalafil, etc)   On the Night Before the Test: Be sure to Drink plenty of water. Do not consume any caffeinated/decaffeinated beverages or chocolate 12 hours prior to your test. Do not take any antihistamines 12 hours prior to your test.  On the Day of the Test: Drink plenty of water until 1 hour prior to the test. Do not eat any food 1 hour prior to test. You may take your  regular medications prior to the test.  Take metoprolol (Lopressor) two hours prior to test. If you take Furosemide/Hydrochlorothiazide/Spironolactone, please HOLD on the morning of the test.       After the Test: Drink plenty of water. After receiving IV contrast, you may experience a mild flushed feeling. This is normal. On occasion, you may experience a mild rash up to 24 hours after the test. This is not dangerous. If this occurs, you can take Benadryl 25 mg and increase your fluid intake. If you experience trouble breathing, this can be serious. If it is severe call 911 IMMEDIATELY. If it is mild, please call our office. If you take any of these medications: Glipizide/Metformin, Avandament, Glucavance, please do not take 48 hours after completing test unless otherwise instructed.  We will call to schedule your test 2-4 weeks out understanding that some insurance companies will need an authorization prior to the service being performed.   For more information and frequently asked questions, please visit our website : [email or web address]  For non-scheduling related questions, please contact the cardiac imaging nurse navigator should you have any questions/concerns: Cardiac Imaging Nurse Navigators Direct Office Dial: [phone]   For scheduling needs, including cancellations and rescheduling, please call [place], [phone].   Follow-Up: At [facility], you and your health needs are our priority.  As part of our continuing mission to provide you with exceptional heart care, we have created designated Provider Care Teams.  These Care Teams include your primary Cardiologist (physician) and Advanced Practice Providers (  APPs -  Physician Assistants and Nurse Practitioners) who all work together to provide you with the care you need, when you need it.  We recommend signing up for the patient portal called "MyChart".  Sign up information is provided on this After  Visit Summary.  MyChart is used to connect with patients for Virtual Visits (Telemedicine).  Patients are able to view lab/test results, encounter notes, upcoming appointments, etc.  Non-urgent messages can be sent to your provider as well.   To learn more about what you can do with MyChart, go to [email or web address].    Your next appointment:   Will be based on the results of the above testing. Provider:   Rollene Rotunda, MD

## 2022-10-01 DIAGNOSIS — R0602 Shortness of breath: Secondary | ICD-10-CM | POA: Diagnosis not present

## 2022-10-01 DIAGNOSIS — Z01812 Encounter for preprocedural laboratory examination: Secondary | ICD-10-CM | POA: Diagnosis not present

## 2022-10-01 DIAGNOSIS — R079 Chest pain, unspecified: Secondary | ICD-10-CM | POA: Diagnosis not present

## 2022-10-02 LAB — BASIC METABOLIC PANEL
BUN/Creatinine Ratio: 20 (ref 10–24)
BUN: 20 mg/dL (ref 8–27)
CO2: 24 mmol/L (ref 20–29)
Calcium: 9.6 mg/dL (ref 8.6–10.2)
Chloride: 106 mmol/L (ref 96–106)
Creatinine, Ser: 1 mg/dL (ref 0.76–1.27)
Glucose: 103 mg/dL — ABNORMAL HIGH (ref 70–99)
Potassium: 5.3 mmol/L — ABNORMAL HIGH (ref 3.5–5.2)
Sodium: 144 mmol/L (ref 134–144)
eGFR: 81 mL/min/{1.73_m2} (ref >59)

## 2022-10-03 ENCOUNTER — Telehealth (HOSPITAL_COMMUNITY): Payer: Self-pay | Admitting: *Deleted

## 2022-10-03 ENCOUNTER — Other Ambulatory Visit: Payer: Self-pay | Admitting: *Deleted

## 2022-10-03 DIAGNOSIS — E875 Hyperkalemia: Secondary | ICD-10-CM

## 2022-10-03 NOTE — Telephone Encounter (Signed)
Reaching out to patient to offer assistance regarding upcoming cardiac imaging study; pt verbalizes understanding of appt date/time, parking situation and where to check in, pre-test NPO status and medications ordered, and verified current allergies; name and call back number provided for further questions should they arise Johney Frame RN Navigator Cardiac Imaging [facility] [phone] office [phone] cell

## 2022-10-04 ENCOUNTER — Ambulatory Visit (HOSPITAL_BASED_OUTPATIENT_CLINIC_OR_DEPARTMENT_OTHER)
Admission: RE | Admit: 2022-10-04 | Discharge: 2022-10-04 | Disposition: A | Discharge status: Home / Self Care | Payer: Medicare HMO | Source: Ambulatory Visit | Attending: Cardiovascular Disease | Admitting: Cardiovascular Disease

## 2022-10-04 ENCOUNTER — Ambulatory Visit (HOSPITAL_COMMUNITY)
Admission: RE | Admit: 2022-10-04 | Discharge: 2022-10-04 | Disposition: A | Discharge status: Home / Self Care | Payer: Medicare HMO | Source: Ambulatory Visit | Attending: Cardiology | Admitting: Cardiology

## 2022-10-04 ENCOUNTER — Other Ambulatory Visit: Payer: Self-pay | Admitting: Cardiovascular Disease

## 2022-10-04 DIAGNOSIS — I2510 Atherosclerotic heart disease of native coronary artery without angina pectoris: Secondary | ICD-10-CM | POA: Diagnosis not present

## 2022-10-04 DIAGNOSIS — R931 Abnormal findings on diagnostic imaging of heart and coronary circulation: Secondary | ICD-10-CM

## 2022-10-04 DIAGNOSIS — E875 Hyperkalemia: Secondary | ICD-10-CM | POA: Diagnosis not present

## 2022-10-04 DIAGNOSIS — R079 Chest pain, unspecified: Secondary | ICD-10-CM | POA: Diagnosis not present

## 2022-10-04 DIAGNOSIS — Q2112 Patent foramen ovale: Secondary | ICD-10-CM | POA: Diagnosis not present

## 2022-10-04 DIAGNOSIS — R0602 Shortness of breath: Secondary | ICD-10-CM | POA: Diagnosis not present

## 2022-10-04 MED ORDER — NITROGLYCERIN 0.4 MG SL SUBL
SUBLINGUAL_TABLET | SUBLINGUAL | Status: AC
  Filled 2022-10-04: qty 2

## 2022-10-04 MED ORDER — NITROGLYCERIN 0.4 MG SL SUBL
0.8000 mg | SUBLINGUAL_TABLET | Freq: Once | SUBLINGUAL | Status: AC
  Administered 2022-10-04: 0.8 mg via SUBLINGUAL

## 2022-10-04 MED ORDER — IOHEXOL 350 MG/ML SOLN
95.0000 mL | Freq: Once | INTRAVENOUS | Status: AC | PRN
  Administered 2022-10-04: 95 mL via INTRAVENOUS

## 2022-10-05 LAB — BASIC METABOLIC PANEL
BUN/Creatinine Ratio: 18 (ref 10–24)
BUN: 17 mg/dL (ref 8–27)
CO2: 24 mmol/L (ref 20–29)
Calcium: 9.6 mg/dL (ref 8.6–10.2)
Chloride: 103 mmol/L (ref 96–106)
Creatinine, Ser: 0.95 mg/dL (ref 0.76–1.27)
Glucose: 95 mg/dL (ref 70–99)
Potassium: 5.1 mmol/L (ref 3.5–5.2)
Sodium: 141 mmol/L (ref 134–144)
eGFR: 87 mL/min/{1.73_m2} (ref >59)

## 2022-10-10 ENCOUNTER — Other Ambulatory Visit: Payer: Self-pay | Admitting: *Deleted

## 2022-10-10 ENCOUNTER — Encounter: Payer: Self-pay | Admitting: *Deleted

## 2022-10-10 ENCOUNTER — Telehealth: Payer: Self-pay | Admitting: *Deleted

## 2022-10-10 DIAGNOSIS — R931 Abnormal findings on diagnostic imaging of heart and coronary circulation: Secondary | ICD-10-CM

## 2022-10-10 NOTE — Telephone Encounter (Signed)
Spoke with pt, he is scheduled for LC&P 10/11/22 @ 10:30 am with dr [place]. Instructions discussed with the patient and sent to him via my chart. He will go to [facility] to have a stat cbc today.

## 2022-10-10 NOTE — Telephone Encounter (Signed)
-----   Message from Rollene Rotunda sent at 10/09/2022  5:12 PM EDT ----- I called the patient with his results.  And also reviewed these results with Dr. Eden Emms.  There is a significant amount of plaque burden.  The patient on this phone call today to discuss results is having increasing exertional angina.  Given this cardiac catheterization is indicated.  The patient understands that risks included but are not limited to stroke (1 in 1000), death (1 in 1000), kidney failure [usually temporary] (1 in 500), bleeding (1 in 200), allergic reaction [possibly serious] (1 in 200).  The patient understands and agrees to proceed.

## 2022-10-11 ENCOUNTER — Other Ambulatory Visit: Payer: Self-pay

## 2022-10-11 ENCOUNTER — Ambulatory Visit (HOSPITAL_COMMUNITY)
Admission: RE | Disposition: A | Discharge status: Home / Self Care | Payer: Self-pay | Source: Home / Self Care | Attending: Cardiology

## 2022-10-11 ENCOUNTER — Ambulatory Visit (HOSPITAL_COMMUNITY)
Admission: RE | Admit: 2022-10-11 | Discharge: 2022-10-11 | Disposition: A | Discharge status: Home / Self Care | Payer: Medicare HMO | Attending: Cardiology | Admitting: Cardiology

## 2022-10-11 DIAGNOSIS — E785 Hyperlipidemia, unspecified: Secondary | ICD-10-CM | POA: Diagnosis not present

## 2022-10-11 DIAGNOSIS — I739 Peripheral vascular disease, unspecified: Secondary | ICD-10-CM | POA: Insufficient documentation

## 2022-10-11 DIAGNOSIS — R079 Chest pain, unspecified: Secondary | ICD-10-CM | POA: Diagnosis not present

## 2022-10-11 DIAGNOSIS — R0602 Shortness of breath: Secondary | ICD-10-CM | POA: Diagnosis not present

## 2022-10-11 DIAGNOSIS — I10 Essential (primary) hypertension: Secondary | ICD-10-CM | POA: Diagnosis not present

## 2022-10-11 DIAGNOSIS — R931 Abnormal findings on diagnostic imaging of heart and coronary circulation: Secondary | ICD-10-CM

## 2022-10-11 DIAGNOSIS — I4510 Unspecified right bundle-branch block: Secondary | ICD-10-CM | POA: Insufficient documentation

## 2022-10-11 DIAGNOSIS — I2510 Atherosclerotic heart disease of native coronary artery without angina pectoris: Secondary | ICD-10-CM | POA: Diagnosis not present

## 2022-10-11 HISTORY — PX: LEFT HEART CATH AND CORONARY ANGIOGRAPHY: CATH118249

## 2022-10-11 HISTORY — PX: CORONARY PRESSURE/FFR STUDY: CATH118243

## 2022-10-11 LAB — CBC
Hematocrit: 46.6 % (ref 37.5–51.0)
Hemoglobin: 15 g/dL (ref 13.0–17.7)
MCH: 33 pg (ref 26.6–33.0)
MCHC: 32.2 g/dL (ref 31.5–35.7)
MCV: 102 fL — ABNORMAL HIGH (ref 79–97)
Platelets: 215 10*3/uL (ref 150–450)
RBC: 4.55 x10E6/uL (ref 4.14–5.80)
RDW: 12.9 % (ref 11.6–15.4)
WBC: 7.6 10*3/uL (ref 3.4–10.8)

## 2022-10-11 LAB — POCT ACTIVATED CLOTTING TIME
Activated Clotting Time: 220 seconds
Activated Clotting Time: 244 seconds

## 2022-10-11 SURGERY — LEFT HEART CATH AND CORONARY ANGIOGRAPHY
Anesthesia: LOCAL

## 2022-10-11 MED ORDER — LIDOCAINE HCL (PF) 1 % IJ SOLN
INTRAMUSCULAR | Status: AC
  Filled 2022-10-11: qty 30

## 2022-10-11 MED ORDER — SODIUM CHLORIDE 0.9 % WEIGHT BASED INFUSION: 1.0000 mL/kg/h | INTRAVENOUS | Status: AC

## 2022-10-11 MED ORDER — MIDAZOLAM HCL 2 MG/2ML IJ SOLN
INTRAMUSCULAR | Status: DC | PRN
  Administered 2022-10-11: 2 mg via INTRAVENOUS

## 2022-10-11 MED ORDER — SODIUM CHLORIDE 0.9 % IV SOLN: 250.0000 mL | INTRAVENOUS | Status: DC | PRN

## 2022-10-11 MED ORDER — ACETAMINOPHEN 325 MG PO TABS: 650.0000 mg | ORAL_TABLET | ORAL | Status: DC | PRN

## 2022-10-11 MED ORDER — NITROGLYCERIN 1 MG/10 ML FOR IR/CATH LAB
INTRA_ARTERIAL | Status: AC
  Filled 2022-10-11: qty 10

## 2022-10-11 MED ORDER — VERAPAMIL HCL 2.5 MG/ML IV SOLN: INTRAVENOUS | Status: DC | PRN

## 2022-10-11 MED ORDER — HYDRALAZINE HCL 20 MG/ML IJ SOLN: 10.0000 mg | INTRAMUSCULAR | Status: DC | PRN

## 2022-10-11 MED ORDER — NITROGLYCERIN 1 MG/10 ML FOR IR/CATH LAB
INTRA_ARTERIAL | Status: DC | PRN
  Administered 2022-10-11: 200 ug via INTRACORONARY

## 2022-10-11 MED ORDER — ONDANSETRON HCL 4 MG/2ML IJ SOLN: 4.0000 mg | Freq: Four times a day (QID) | INTRAMUSCULAR | Status: DC | PRN

## 2022-10-11 MED ORDER — HYDRALAZINE HCL 20 MG/ML IJ SOLN
INTRAMUSCULAR | Status: AC
  Filled 2022-10-11: qty 1

## 2022-10-11 MED ORDER — IOHEXOL 350 MG/ML SOLN
INTRAVENOUS | Status: DC | PRN
  Administered 2022-10-11: 70 mL

## 2022-10-11 MED ORDER — HEPARIN (PORCINE) IN NACL 1000-0.9 UT/500ML-% IV SOLN
INTRAVENOUS | Status: DC | PRN
  Administered 2022-10-11 (×2): 500 mL

## 2022-10-11 MED ORDER — ASPIRIN 81 MG PO CHEW
81.0000 mg | CHEWABLE_TABLET | ORAL | Status: AC
  Administered 2022-10-11: 81 mg via ORAL

## 2022-10-11 MED ORDER — MIDAZOLAM HCL 2 MG/2ML IJ SOLN
INTRAMUSCULAR | Status: AC
  Filled 2022-10-11: qty 2

## 2022-10-11 MED ORDER — FENTANYL CITRATE (PF) 100 MCG/2ML IJ SOLN
INTRAMUSCULAR | Status: DC | PRN
  Administered 2022-10-11: 25 ug via INTRAVENOUS

## 2022-10-11 MED ORDER — VERAPAMIL HCL 2.5 MG/ML IV SOLN
INTRAVENOUS | Status: AC
  Filled 2022-10-11: qty 2

## 2022-10-11 MED ORDER — FENTANYL CITRATE (PF) 100 MCG/2ML IJ SOLN
INTRAMUSCULAR | Status: AC
  Filled 2022-10-11: qty 2

## 2022-10-11 MED ORDER — SODIUM CHLORIDE 0.9 % WEIGHT BASED INFUSION
3.0000 mL/kg/h | INTRAVENOUS | Status: AC
  Administered 2022-10-11: 3 mL/kg/h via INTRAVENOUS

## 2022-10-11 MED ORDER — SODIUM CHLORIDE 0.9 % IV BOLUS: 250.0000 mL | Freq: Once | INTRAVENOUS | Status: DC

## 2022-10-11 MED ORDER — SODIUM CHLORIDE 0.9% FLUSH: 3.0000 mL | Freq: Two times a day (BID) | INTRAVENOUS | Status: DC

## 2022-10-11 MED ORDER — HEPARIN SODIUM (PORCINE) 1000 UNIT/ML IJ SOLN
INTRAMUSCULAR | Status: DC | PRN
  Administered 2022-10-11: 9000 [IU] via INTRAVENOUS

## 2022-10-11 MED ORDER — SODIUM CHLORIDE 0.9% FLUSH: 3.0000 mL | INTRAVENOUS | Status: DC | PRN

## 2022-10-11 MED ORDER — ASPIRIN 81 MG PO CHEW: 81.0000 mg | CHEWABLE_TABLET | ORAL | Status: DC

## 2022-10-11 MED ORDER — HEPARIN SODIUM (PORCINE) 1000 UNIT/ML IJ SOLN
INTRAMUSCULAR | Status: AC
  Filled 2022-10-11: qty 10

## 2022-10-11 MED ORDER — SODIUM CHLORIDE 0.9 % WEIGHT BASED INFUSION: 1.0000 mL/kg/h | INTRAVENOUS | Status: DC

## 2022-10-11 MED ORDER — LIDOCAINE HCL (PF) 1 % IJ SOLN
INTRAMUSCULAR | Status: DC | PRN
  Administered 2022-10-11: 2 mL via INTRADERMAL
  Administered 2022-10-11: 15 mL via INTRADERMAL

## 2022-10-11 SURGICAL SUPPLY — 20 items
CATH INFINITI 5FR MULTPACK ANG (CATHETERS) IMPLANT
CATH LAUNCHER 6FR EBU3.5 (CATHETERS) IMPLANT
CATH VISTA GUIDE 6FR H STICK (CATHETERS) IMPLANT
CLOSURE MYNX CONTROL 6F/7F (Vascular Products) IMPLANT
DEVICE RAD COMP TR BAND LRG (VASCULAR PRODUCTS) IMPLANT
GLIDESHEATH SLEND SS 6F .021 (SHEATH) IMPLANT
GUIDEWIRE INQWIRE 1.5J.035X260 (WIRE) IMPLANT
GUIDEWIRE PRESSURE X 175 (WIRE) IMPLANT
INQWIRE 1.5J .035X260CM (WIRE) ×1
KIT ESSENTIALS PG (KITS) IMPLANT
KIT MICROPUNCTURE NIT STIFF (SHEATH) IMPLANT
PACK CARDIAC CATHETERIZATION (CUSTOM PROCEDURE TRAY) ×1 IMPLANT
PROTECTION STATION PRESSURIZED (MISCELLANEOUS) ×1
SET ATX-X65L (MISCELLANEOUS) IMPLANT
SHEATH PINNACLE 5F 10CM (SHEATH) IMPLANT
SHEATH PINNACLE 6F 10CM (SHEATH) IMPLANT
SHEATH PROBE COVER 6X72 (BAG) IMPLANT
STATION PROTECTION PRESSURIZED (MISCELLANEOUS) IMPLANT
TUBING CIL FLEX 10 FLL-RA (TUBING) IMPLANT
WIRE EMERALD 3MM-J .035X150CM (WIRE) IMPLANT

## 2022-10-11 NOTE — Interval H&P Note (Signed)
History and Physical Interval Note:  10/11/2022 11:07 AM  Keith Thompson  has presented today for surgery, with the diagnosis of abnormal ct.  The various methods of treatment have been discussed with the patient and family. After consideration of risks, benefits and other options for treatment, the patient has consented to  Procedure(s): LEFT HEART CATH AND CORONARY ANGIOGRAPHY (N/A) as a surgical intervention.  The patient's history has been reviewed, patient examined, no change in status, stable for surgery.  I have reviewed the patient's chart and labs.  Questions were answered to the patient's satisfaction.   Cath Lab Visit (complete for each Cath Lab visit)  Clinical Evaluation Leading to the Procedure:   ACS: No.  Non-ACS:    Anginal Classification: CCS II  Anti-ischemic medical therapy: No Therapy  Non-Invasive Test Results: Low-risk stress test findings: cardiac mortality <1%/year  Prior CABG: No previous CABG        [name] 10/11/2022 11:07 AM

## 2022-10-11 NOTE — Discharge Instructions (Signed)
Drink plenty of fluids for 48 hours and keep wrist elevated at heart level for 24 hours  Radial Site Care   This sheet gives you information about how to care for yourself after your procedure. Your health care provider may also give you more specific instructions. If you have problems or questions, contact your health care provider. What can I expect after the procedure? After the procedure, it is common to have: Bruising and tenderness at the catheter insertion area. Follow these instructions at home: Medicines Take over-the-counter and prescription medicines only as told by your health care provider. Insertion site care Follow instructions from your health care provider about how to take care of your insertion site. Make sure you: Wash your hands with soap and water before you change your bandage (dressing). If soap and water are not available, use hand sanitizer. Remove your dressing as told by your health care provider. In 24 hours Check your insertion site every day for signs of infection. Check for: Redness, swelling, or pain. Fluid or blood. Pus or a bad smell. Warmth. Do not take baths, swim, or use a hot tub until your health care provider approves. You may shower 24-48 hours after the procedure, or as directed by your health care provider. Remove the dressing and gently wash the site with plain soap and water. Pat the area dry with a clean towel. Do not rub the site. That could cause bleeding. Do not apply powder or lotion to the site. Activity   For 24 hours after the procedure, or as directed by your health care provider: Do not flex or bend the affected arm. Do not push or pull heavy objects with the affected arm. Do not drive yourself home from the hospital or clinic. You may drive 24 hours after the procedure unless your health care provider tells you not to. Do not operate machinery or power tools. Do not lift anything that is heavier than 10 lb (4.5 kg), or the  limit that you are told, until your health care provider says that it is safe.  For 4 days Ask your health care provider when it is okay to: Return to work or school. Resume usual physical activities or sports. Resume sexual activity. General instructions If the catheter site starts to bleed, raise your arm and put firm pressure on the site. If the bleeding does not stop, get help right away. This is a medical emergency. If you went home on the same day as your procedure, a responsible adult should be with you for the first 24 hours after you arrive home. Keep all follow-up visits as told by your health care provider. This is important. Contact a health care provider if: You have a fever. You have redness, swelling, or yellow drainage around your insertion site. Get help right away if: You have unusual pain at the radial site. The catheter insertion area swells very fast. The insertion area is bleeding, and the bleeding does not stop when you hold steady pressure on the area. Your arm or hand becomes pale, cool, tingly, or numb. These symptoms may represent a serious problem that is an emergency. Do not wait to see if the symptoms will go away. Get medical help right away. Call your local emergency services (911 in the [place] Do not drive yourself to the hospital. Summary After the procedure, it is common to have bruising and tenderness at the site. Follow instructions from your health care provider about how to take care of your  radial site wound. Check the wound every day for signs of infection. Do not lift anything that is heavier than 10 lb (4.5 kg), or the limit that you are told, until your health care provider says that it is safe. This information is not intended to replace advice given to you by your health care provider. Make sure you discuss any questions you have with your health care provider. Document Revised: 02/06/2017 Document Reviewed: 02/06/2017 Elsevier Patient Education   2020 Elsevier Inc. Femoral Site Care This sheet gives you information about how to care for yourself after your procedure. Your health care provider may also give you more specific instructions. If you have problems or questions, contact your health care provider. What can I expect after the procedure?  After the procedure, it is common to have: Bruising that usually fades within 1-2 weeks. Tenderness at the site. Follow these instructions at home: Wound care Follow instructions from your health care provider about how to take care of your insertion site. Make sure you: Wash your hands with soap and water before you change your bandage (dressing). If soap and water are not available, use hand sanitizer. Remove your dressing as told by your health care provider. In 24 hours Do not take baths, swim, or use a hot tub until your health care provider approves. You may shower 24-48 hours after the procedure or as told by your health care provider. Gently wash the site with plain soap and water. Pat the area dry with a clean towel. Do not rub the site. This may cause bleeding. Do not apply powder or lotion to the site. Keep the site clean and dry. Check your femoral site every day for signs of infection. Check for: Redness, swelling, or pain. Fluid or blood. Warmth. Pus or a bad smell. Activity For the first 2-3 days after your procedure, or as long as directed: Avoid climbing stairs as much as possible. Do not squat. Do not lift anything that is heavier than 10 lb (4.5 kg), or the limit that you are told, until your health care provider says that it is safe. For 5 days Rest as directed. Avoid sitting for a long time without moving. Get up to take short walks every 1-2 hours. Do not drive for 24 hours if you were given a medicine to help you relax (sedative). General instructions Take over-the-counter and prescription medicines only as told by your health care provider. Keep all follow-up  visits as told by your health care provider. This is important. Contact a health care provider if you have: A fever or chills. You have redness, swelling, or pain around your insertion site. Get help right away if: The catheter insertion area swells very fast. You pass out. You suddenly start to sweat or your skin gets clammy. The catheter insertion area is bleeding, and the bleeding does not stop when you hold steady pressure on the area. The area near or just beyond the catheter insertion site becomes pale, cool, tingly, or numb. These symptoms may represent a serious problem that is an emergency. Do not wait to see if the symptoms will go away. Get medical help right away. Call your local emergency services (911 in the [place] Do not drive yourself to the hospital. Summary After the procedure, it is common to have bruising that usually fades within 1-2 weeks. Check your femoral site every day for signs of infection. Do not lift anything that is heavier than 10 lb (4.5 kg), or the limit  that you are told, until your health care provider says that it is safe. This information is not intended to replace advice given to you by your health care provider. Make sure you discuss any questions you have with your health care provider. Document Revised: 01/14/2017 Document Reviewed: 01/14/2017 Elsevier Patient Education  2020 [employer].

## 2022-10-12 ENCOUNTER — Encounter (HOSPITAL_COMMUNITY): Payer: Self-pay | Admitting: Cardiology

## 2022-10-12 MED FILL — Verapamil HCl IV Soln 2.5 MG/ML: INTRAVENOUS | Qty: 2 | Status: AC

## 2022-10-24 ENCOUNTER — Telehealth: Payer: Self-pay | Admitting: Family Medicine

## 2022-10-24 NOTE — Telephone Encounter (Signed)
Please call patient and schedule him for an office visit so that we can discuss the findings on his recent CT he had with cardiology.  The radiology over read on that CT recommended following up with a different type of image, for findings surrounding the stomach/spleen.  Prior image suggested this may be from the recent trauma he had with his ribs, we will discuss this in more detail, including comparing to prior image order appropriate imaging study for follow-up at his appointment.

## 2022-10-25 NOTE — Telephone Encounter (Signed)
Pt scheduled  

## 2022-11-01 ENCOUNTER — Ambulatory Visit: Payer: Medicare HMO | Attending: General Practice | Admitting: Emergency Medicine

## 2022-11-01 ENCOUNTER — Encounter: Payer: Self-pay | Admitting: Emergency Medicine

## 2022-11-01 ENCOUNTER — Ambulatory Visit: Payer: Medicare HMO | Admitting: Family Medicine

## 2022-11-01 ENCOUNTER — Encounter: Payer: Self-pay | Admitting: Family Medicine

## 2022-11-01 VITALS — BP 132/86 | HR 67 | Temp 98.0°F | Wt 212.6 lb

## 2022-11-01 VITALS — BP 130/70 | HR 69 | Ht 70.0 in | Wt 209.0 lb

## 2022-11-01 DIAGNOSIS — E785 Hyperlipidemia, unspecified: Secondary | ICD-10-CM | POA: Diagnosis not present

## 2022-11-01 DIAGNOSIS — G4730 Sleep apnea, unspecified: Secondary | ICD-10-CM | POA: Diagnosis not present

## 2022-11-01 DIAGNOSIS — Z85810 Personal history of malignant neoplasm of tongue: Secondary | ICD-10-CM

## 2022-11-01 DIAGNOSIS — R935 Abnormal findings on diagnostic imaging of other abdominal regions, including retroperitoneum: Secondary | ICD-10-CM

## 2022-11-01 DIAGNOSIS — Z9081 Acquired absence of spleen: Secondary | ICD-10-CM | POA: Diagnosis not present

## 2022-11-01 DIAGNOSIS — I10 Essential (primary) hypertension: Secondary | ICD-10-CM

## 2022-11-01 DIAGNOSIS — I6522 Occlusion and stenosis of left carotid artery: Secondary | ICD-10-CM | POA: Diagnosis not present

## 2022-11-01 DIAGNOSIS — I2510 Atherosclerotic heart disease of native coronary artery without angina pectoris: Secondary | ICD-10-CM

## 2022-11-01 DIAGNOSIS — R0602 Shortness of breath: Secondary | ICD-10-CM | POA: Diagnosis not present

## 2022-11-01 DIAGNOSIS — Z23 Encounter for immunization: Secondary | ICD-10-CM | POA: Diagnosis not present

## 2022-11-01 DIAGNOSIS — R079 Chest pain, unspecified: Secondary | ICD-10-CM

## 2022-11-01 MED ORDER — NITROGLYCERIN 0.4 MG SL SUBL: 0.4000 mg | SUBLINGUAL_TABLET | SUBLINGUAL | 3 refills | Status: AC | PRN

## 2022-11-01 NOTE — Patient Instructions (Addendum)
Medication Instructions:  The current medical regimen is effective;  continue present plan and medications as directed. Please refer to the Current Medication list given to you today.  *If you need a refill on your cardiac medications before your next appointment, please call your pharmacy*  Lab Work: NONE  Other Instructions PLEASE READ AND FOLLOW HEART HEALTHY DIET PLEASE READ AND FOLLOW ATTACHED  SALTY 6  ITAMAR SLEEP STUDY-SEE ATTACHED  Follow-Up: At [facility], you and your health needs are our priority.  As part of our continuing mission to provide you with exceptional heart care, we have created designated Provider Care Teams.  These Care Teams include your primary Cardiologist (physician) and Advanced Practice Providers (APPs -  Physician Assistants and Nurse Practitioners) who all work together to provide you with the care you need, when you need it.  Your next appointment:   2-3 month(s)  Provider:   Rollene Rotunda, MD  or Edd Fabian, FNP          WatchPAT?  Is a FDA cleared portable home sleep study test that uses a watch and 3 points of contact to monitor 7 different channels, including your heart rate, oxygen saturations, body position, snoring, and chest motion.  The study is easy to use from the comfort of your own home and accurately detect sleep apnea.  Before bed, you attach the chest sensor, attached the sleep apnea bracelet to your nondominant hand, and attach the finger probe.  After the study, the raw data is downloaded from the watch and scored for apnea events.   For more information: [email or web address]  Patient Testing Instructions:  Do not put battery into the device until bedtime when you are ready to begin the test. Please call the support number if you need assistance after following the instructions below: 24 hour support line- [phone] or ITAMAR support at [phone] (option 2)  Download the IntelWatchPAT One"  app through the google play store or App Store  Be sure to turn on or enable access to bluetooth in settlings on your smartphone/ device  Make sure no other bluetooth devices are on and within the vicinity of your smartphone/ device and WatchPAT watch during testing.  Make sure to leave your smart phone/ device plugged in and charging all night.  When ready for bed:  Follow the instructions step by step in the WatchPAT One App to activate the testing device. For additional instructions, including video instruction, visit the WatchPAT One video on Youtube. You can search for WatchPat One within Youtube (video is 4 minutes and 18 seconds) or enter: https://youtube/watch?v=BCce_vbiwxE Please note: You will be prompted to enter a Pin to connect via bluetooth when starting the test. The PIN will be assigned to you when you receive the test.  The device is disposable, but it recommended that you retain the device until you receive a call letting you know the study has been received and the results have been interpreted.  We will let you know if the study did not transmit to [place] properly after the test is completed. You do not need to call us to confirm the receipt of the test.  Please complete the test within 48 hours of receiving PIN.   Frequently Asked Questions:  What is Watch Dennie Bible one?  A single use fully disposable home sleep apnea testing device and will not need to be returned after completion.  What are the requirements to use WatchPAT one?  The be able to have a  successful watchpat one sleep study, you should have your Watch pat one device, your smart phone, watch pat one app, your PIN number and Internet access What type of phone do I need?  You should have a smart phone that uses Android 5.1 and above or any Iphone with IOS 10 and above How can I download the WatchPAT one app?  Based on your device type search for WatchPAT one app either in google play for android devices or APP store for  Iphone's Where will I get my PIN for the study?  Your PIN will be provided by your physician's office. It is used for authentication and if you lose/forget your PIN, please reach out to your providers office.  I do not have Internet at home. Can I do WatchPAT one study?  WatchPAT One needs Internet connection throughout the night to be able to transmit the sleep data. You can use your home/local internet or your cellular's data package. However, it is always recommended to use home/local Internet. It is estimated that between 20MB-30MB will be used with each study.However, the application will be looking for space in the phone to start the study.  What happens if I lose internet or bluetooth connection?  During the internet disconnection, your phone will not be able to transmit the sleep data. All the data, will be stored in your phone. As soon as the internet connection is back on, the phone will being sending the sleep data. During the bluetooth disconnection, WatchPAT one will not be able to to send the sleep data to your phone. Data will be kept in the [facility] one until two devices have bluetooth connection back on. As soon as the connection is back on, WatchPAT one will send the sleep data to the phone.  How long do I need to wear the WatchPAT one?  After you start the study, you should wear the device at least 6 hours.  How far should I keep my phone from the device?  During the night, your phone should be within 15 feet.  What happens if I leave the room for restroom or other reasons?  Leaving the room for any reason will not cause any problem. As soon as your get back to the room, both devices will reconnect and will continue to send the sleep data. Can I use my phone during the sleep study?  Yes, you can use your phone as usual during the study. But it is recommended to put your watchpat one on when you are ready to go to bed.  How will I get my study results?  A soon as you completed  your study, your sleep data will be sent to the provider. They will then share the results with you when they are ready.

## 2022-11-01 NOTE — Patient Instructions (Signed)
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
## 2022-11-01 NOTE — Progress Notes (Signed)
Patient agreement reviewed and signed on 11/01/2022.  WatchPAT issued to patient on 11/01/2022 by Scheryl Marten, RN. Patient aware to not open the WatchPAT box until contacted with the activation PIN. Device serial number: [id number]

## 2022-11-02 ENCOUNTER — Encounter: Payer: Self-pay | Admitting: Emergency Medicine

## 2022-11-02 ENCOUNTER — Telehealth: Payer: Self-pay

## 2022-11-02 NOTE — Telephone Encounter (Signed)
Patient agreement reviewed and signed on 11/01/22.  WatchPAT issued to patient on 11/01/22 by Orpah Cobb, RN. Patient aware to not open the WatchPAT box until contacted with the activation PIN. Patient profile initialized in CloudPAT on 11/02/2022 by Ashley Mariner, CMA. Device serial number: [id number]

## 2022-11-05 ENCOUNTER — Inpatient Hospital Stay
Admission: RE | Admit: 2022-11-05 | Discharge: 2022-11-05 | Discharge status: Home / Self Care | Payer: Medicare HMO | Source: Ambulatory Visit | Attending: Family Medicine | Admitting: Family Medicine

## 2022-11-05 DIAGNOSIS — K8020 Calculus of gallbladder without cholecystitis without obstruction: Secondary | ICD-10-CM | POA: Diagnosis not present

## 2022-11-05 DIAGNOSIS — Z9081 Acquired absence of spleen: Secondary | ICD-10-CM | POA: Diagnosis not present

## 2022-11-05 DIAGNOSIS — Z85810 Personal history of malignant neoplasm of tongue: Secondary | ICD-10-CM

## 2022-11-05 DIAGNOSIS — R935 Abnormal findings on diagnostic imaging of other abdominal regions, including retroperitoneum: Secondary | ICD-10-CM

## 2022-11-05 MED ORDER — GADOPICLENOL 0.5 MMOL/ML IV SOLN
10.0000 mL | Freq: Once | INTRAVENOUS | Status: AC | PRN
  Administered 2022-11-05: 10 mL via INTRAVENOUS

## 2022-11-09 ENCOUNTER — Encounter: Payer: Self-pay | Admitting: Family Medicine

## 2022-11-09 DIAGNOSIS — D7389 Other diseases of spleen: Secondary | ICD-10-CM | POA: Insufficient documentation

## 2022-11-19 ENCOUNTER — Other Ambulatory Visit: Payer: Self-pay | Admitting: *Deleted

## 2022-11-19 DIAGNOSIS — I6521 Occlusion and stenosis of right carotid artery: Secondary | ICD-10-CM

## 2022-11-19 DIAGNOSIS — I6522 Occlusion and stenosis of left carotid artery: Secondary | ICD-10-CM
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
## 2022-11-27 ENCOUNTER — Encounter: Payer: Self-pay | Admitting: Vascular Surgery

## 2022-11-27 ENCOUNTER — Ambulatory Visit: Payer: Medicare HMO | Admitting: Vascular Surgery

## 2022-11-27 ENCOUNTER — Ambulatory Visit (HOSPITAL_COMMUNITY)
Admission: RE | Admit: 2022-11-27 | Discharge: 2022-11-27 | Disposition: A | Discharge status: Home / Self Care | Payer: Medicare HMO | Source: Ambulatory Visit | Attending: Vascular Surgery | Admitting: Vascular Surgery

## 2022-11-27 VITALS — BP 142/94 | HR 65 | Temp 98.0°F | Resp 20 | Ht 70.0 in | Wt 206.7 lb

## 2022-11-27 DIAGNOSIS — I6522 Occlusion and stenosis of left carotid artery: Secondary | ICD-10-CM | POA: Insufficient documentation

## 2022-11-27 DIAGNOSIS — I6521 Occlusion and stenosis of right carotid artery: Secondary | ICD-10-CM | POA: Diagnosis not present

## 2022-12-03 ENCOUNTER — Ambulatory Visit (HOSPITAL_COMMUNITY): Payer: Medicare HMO | Attending: Emergency Medicine

## 2022-12-03 DIAGNOSIS — R0602 Shortness of breath: Secondary | ICD-10-CM | POA: Diagnosis not present

## 2022-12-03 DIAGNOSIS — R079 Chest pain, unspecified: Secondary | ICD-10-CM | POA: Diagnosis not present

## 2022-12-03 LAB — ECHOCARDIOGRAM COMPLETE
Area-P 1/2: 2.75 cm2
P 1/2 time: 599 ms
S' Lateral: 3.2 cm

## 2022-12-07 ENCOUNTER — Other Ambulatory Visit: Payer: Self-pay

## 2022-12-07 DIAGNOSIS — I6522 Occlusion and stenosis of left carotid artery: Secondary | ICD-10-CM
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
## 2023-01-01 ENCOUNTER — Ambulatory Visit: Payer: Medicare HMO | Attending: General Practice | Admitting: General Practice

## 2023-01-01 ENCOUNTER — Encounter: Payer: Self-pay | Admitting: General Practice

## 2023-01-01 VITALS — BP 102/74 | HR 75 | Ht 69.0 in | Wt 212.6 lb

## 2023-01-01 DIAGNOSIS — R0602 Shortness of breath: Secondary | ICD-10-CM

## 2023-01-01 DIAGNOSIS — I6522 Occlusion and stenosis of left carotid artery: Secondary | ICD-10-CM | POA: Diagnosis not present

## 2023-01-01 DIAGNOSIS — I10 Essential (primary) hypertension: Secondary | ICD-10-CM | POA: Diagnosis not present

## 2023-01-01 DIAGNOSIS — I2510 Atherosclerotic heart disease of native coronary artery without angina pectoris: Secondary | ICD-10-CM

## 2023-01-01 DIAGNOSIS — G4730 Sleep apnea, unspecified: Secondary | ICD-10-CM | POA: Diagnosis not present

## 2023-01-01 NOTE — Patient Instructions (Signed)
Medication Instructions:  The current medical regimen is effective;  continue present plan and medications as directed. Please refer to the Current Medication list given to you today.  *If you need a refill on your cardiac medications before your next appointment, please call your pharmacy*  Lab Work: NONE  Other Instructions MAINTAIN YOUR PHYSICAL ACTIVITY NO CAFFEINE   Follow-Up: At [facility], you and your health needs are our priority.  As part of our continuing mission to provide you with exceptional heart care, we have created designated Provider Care Teams.  These Care Teams include your primary Cardiologist (physician) and Advanced Practice Providers (APPs -  Physician Assistants and Nurse Practitioners) who all work together to provide you with the care you need, when you need it.  Your next appointment:   9 month(s)  Provider:   Rollene Rotunda, MD  or Edd Fabian, FNP       CALL IN IN JULY 2025 FOR SEPTEMBER 2025 APPOINTMENT

## 2023-01-15 ENCOUNTER — Telehealth: Payer: Self-pay

## 2023-01-15 NOTE — Telephone Encounter (Signed)
**Note De-Identified Graden Hoshino Obfuscation** Ordering provider: Josefa Beauvais, NP Associated diagnoses: OSA-G47.33 and Fatigue-R53.83 WatchPAT PA obtained on 01/15/2023 by Demecia Northway, Avelina HERO, LPN. Authorization: Per [employer]ail[employer] Provider Portal: Procedure Code 1 [id number] Procedure From - To Date 2023-01-15 Status: NO AUTH REQUIRED Patient notified of PIN (1234) on 01/15/2023 Mayra Jolliffe Notification Method: phone.  Phone note routed to covering staff for follow-up.

## 2023-01-18 ENCOUNTER — Encounter (INDEPENDENT_AMBULATORY_CARE_PROVIDER_SITE_OTHER): Payer: Self-pay | Admitting: Cardiology

## 2023-01-18 DIAGNOSIS — G4733 Obstructive sleep apnea (adult) (pediatric): Secondary | ICD-10-CM | POA: Diagnosis not present

## 2023-01-20 ENCOUNTER — Ambulatory Visit: Payer: Medicare HMO | Attending: General Practice

## 2023-01-20 DIAGNOSIS — I6522 Occlusion and stenosis of left carotid artery: Secondary | ICD-10-CM

## 2023-01-20 DIAGNOSIS — I2510 Atherosclerotic heart disease of native coronary artery without angina pectoris: Secondary | ICD-10-CM

## 2023-01-20 DIAGNOSIS — G4730 Sleep apnea, unspecified: Secondary | ICD-10-CM

## 2023-01-20 NOTE — Procedures (Signed)
   SLEEP STUDY REPORT Patient Information Study Date: 01/18/2023 Patient Name: Keith Thompson Patient ID: [id number] Birth Date: October 16, 1955 Age: 68 Gender: Male BMI: 30.0 (W=209 lb, H=5' 10'') Referring Physician: Josefa Beauvais, NP  TEST DESCRIPTION: Home sleep apnea testing was completed using the WatchPat, a Type 1 device, utilizing peripheral arterial tonometry (PAT), chest movement, actigraphy, pulse oximetry, pulse rate, body position and snore. AHI was calculated with apnea and hypopnea using valid sleep time as the denominator. RDI includes apneas, hypopneas, and RERAs. The data acquired and the scoring of sleep and all associated events were performed in accordance with the recommended standards and specifications as outlined in the AASM Manual for the Scoring of Sleep and Associated Events 2.2.0 (2015).  FINDINGS:  1. Mild Obstructive Sleep Apnea with AHI 5.8/hr.  2. No Central Sleep Apnea with pAHIc 0.7/hr.  3. Oxygen desaturations as low as 88%.  4. Mild snoring was present. O2 sats were < 88% for 0.2 min.  5. Total sleep time was 7 hrs and 24 min.  6. 29.5% of total sleep time was spent in REM sleep.  7. Noral sleep onset latency at 17 min.  8. Shortened REM sleep onset latency at 53 min.  9. Total awakenings were 7. 10. Arrhythmia detection: None  DIAGNOSIS: Mild Obstructive Sleep Apnea (G47.33)  RECOMMENDATIONS: 1. Clinical correlation of these findings is necessary. The decision to treat obstructive sleep apnea (OSA) is usually based on the presence of apnea symptoms or the presence of associated medical conditions such as Hypertension, Congestive Heart Failure, Atrial Fibrillation or Obesity. The most common symptoms of OSA are snoring, gasping for breath while sleeping, daytime sleepiness and fatigue.  2. Initiating apnea therapy is recommended given the presence of symptoms and/or associated conditions. Recommend proceeding with one of the following:   a.  Auto-CPAP therapy with a pressure range of 5-20cm H2O.   b. An oral appliance (OA) that can be obtained from certain dentists with expertise in sleep medicine. These are primarily of use in non-obese patients with mild and moderate disease.   c. An ENT consultation which may be useful to look for specific causes of obstruction and possible treatment options.   d. If patient is intolerant to PAP therapy, consider referral to ENT for evaluation for hypoglossal nerve stimulator.  3. Close follow-up is necessary to ensure success with CPAP or oral appliance therapy for maximum benefit .  4. A follow-up oximetry study on CPAP is recommended to assess the adequacy of therapy and determine the need for supplemental oxygen or the potential need for Bi-level therapy. An arterial blood gas to determine the adequacy of baseline ventilation and oxygenation should also be considered.  5. Healthy sleep recommendations include: adequate nightly sleep (normal 7-9 hrs/night), avoidance of caffeine after noon and alcohol  near bedtime, and maintaining a sleep environment that is cool, dark and quiet.  6. Weight loss for overweight patients is recommended. Even modest amounts of weight loss can significantly improve the severity of sleep apnea.  7. Snoring recommendations include: weight loss where appropriate, side sleeping, and avoidance of alcohol  before bed.  8. Operation of motor vehicle should be avoided when sleepy.  Signature: Wilbert Bihari, MD; [facility]; Diplomat, American Board of Sleep Medicine Electronically Signed: 01/20/2023 8:08:06 PM 01/18/2023,[id number],02/08/1955,Male Page 2 of

## 2023-01-21 ENCOUNTER — Ambulatory Visit: Payer: Medicare HMO | Admitting: Family Medicine

## 2023-01-23 ENCOUNTER — Telehealth: Payer: Self-pay

## 2023-01-23 NOTE — Telephone Encounter (Signed)
 Notified patient of sleep study results and recommendations. Message will be sent to [facility] for scheduling patient to discuss treatment options. All questions were answered and patient verbalized understanding.

## 2023-01-23 NOTE — Telephone Encounter (Signed)
-----   Message from Armanda Magic sent at 01/20/2023  8:11 PM EST ----- Patient has very mild OSA - set up OV to discuss treatment options.

## 2023-02-06 ENCOUNTER — Telehealth: Payer: Self-pay | Admitting: *Deleted

## 2023-02-06 NOTE — Telephone Encounter (Signed)
   [facility] Pre-operative Risk Assessment    Patient Name: Keith Thompson  DOB: 12/09/1955 MRN: [id number]  HEARTCARE STAFF:  - IMPORTANT!!!!!! Under Visit Info/Reason for Call, type in Other and utilize the format Clearance MM/DD/YY or Clearance TBD. Do not use dashes or single digits. - Please review there is not already an duplicate clearance open for this procedure. - If request is for dental extraction, please clarify the # of teeth to be extracted. - If the patient is currently at the dentist's office, call Pre-Op Callback Staff (MA/nurse) to input urgent request.  - If the patient is not currently in the dentist office, please route to the Pre-Op pool.  Request for surgical clearance:  What type of surgery is being performed? Extraction of 3 teeth, possible crowns or bridges  When is this surgery scheduled? TBD  What type of clearance is required (medical clearance vs. Pharmacy clearance to hold med vs. Both)? both  Are there any medications that need to be held prior to surgery and how long? Pt on Asa [address]  Practice name and name of physician performing surgery? [facility], Dr Yehuda Mao  What is the office phone number? [phone]   7.   What is the office fax number? [phone]  8.   Anesthesia type (None, local, MAC, general) ? local   Valrie Hart 02/06/2023, 4:39 PM  _________________________________________________________________   (provider comments below)

## 2023-02-06 NOTE — Telephone Encounter (Signed)
Keith Thompson,  We have received a surgical clearance request for Keith Thompson for 3 dental extractions with possible crown and bridges. They were seen recently in clinic on 01/01/2023. Can you please comment on surgical clearance for his upcoming dental procedure. Please forward you guidance and recommendations to P [facility] PREOP   Thanks, Robin Searing, NP

## 2023-02-07 NOTE — Telephone Encounter (Signed)
   Patient Name: Keith Thompson  DOB: 20-Apr-1955 MRN: [id number]  Primary Cardiologist: Rollene Rotunda, MD  Chart reviewed as part of pre-operative protocol coverage. Given past medical history and time since last visit, based on ACC/AHA guidelines, Keith Thompson is at acceptable risk for the planned procedure without further cardiovascular testing.   The patient was advised that if he develops new symptoms prior to surgery to contact our office to arrange for a follow-up visit, and he verbalized understanding.  Regarding ASA therapyit may be stopped 5-7 days prior to surgery with a plan to resume it as soon as felt to be feasible from a surgical standpoint in the post-operative period.   I will route this recommendation to the requesting party via Epic fax function and remove from pre-op pool.  Please call with questions.  Napoleon Form, Leodis Rains, NP 02/07/2023, 12:45 PM

## 2023-02-14 ENCOUNTER — Other Ambulatory Visit: Payer: Self-pay | Admitting: Family Medicine

## 2023-02-20 ENCOUNTER — Encounter: Payer: Self-pay | Admitting: Family Medicine

## 2023-02-20 ENCOUNTER — Ambulatory Visit: Payer: Medicare HMO | Admitting: Family Medicine

## 2023-02-20 VITALS — BP 138/82 | HR 66 | Temp 98.2°F | Wt 214.6 lb

## 2023-02-20 DIAGNOSIS — Z79899 Other long term (current) drug therapy: Secondary | ICD-10-CM

## 2023-02-20 DIAGNOSIS — I6522 Occlusion and stenosis of left carotid artery: Secondary | ICD-10-CM

## 2023-02-20 DIAGNOSIS — I679 Cerebrovascular disease, unspecified: Secondary | ICD-10-CM

## 2023-02-20 DIAGNOSIS — E890 Postprocedural hypothyroidism: Secondary | ICD-10-CM

## 2023-02-20 DIAGNOSIS — G4733 Obstructive sleep apnea (adult) (pediatric): Secondary | ICD-10-CM | POA: Diagnosis not present

## 2023-02-20 DIAGNOSIS — Z9081 Acquired absence of spleen: Secondary | ICD-10-CM

## 2023-02-20 DIAGNOSIS — Z85810 Personal history of malignant neoplasm of tongue: Secondary | ICD-10-CM | POA: Diagnosis not present

## 2023-02-20 DIAGNOSIS — J386 Stenosis of larynx: Secondary | ICD-10-CM

## 2023-02-20 DIAGNOSIS — I10 Essential (primary) hypertension: Secondary | ICD-10-CM | POA: Diagnosis not present

## 2023-02-20 DIAGNOSIS — E785 Hyperlipidemia, unspecified: Secondary | ICD-10-CM

## 2023-02-20 DIAGNOSIS — E559 Vitamin D deficiency, unspecified: Secondary | ICD-10-CM

## 2023-02-20 DIAGNOSIS — I739 Peripheral vascular disease, unspecified: Secondary | ICD-10-CM

## 2023-02-20 DIAGNOSIS — Z5181 Encounter for therapeutic drug level monitoring: Secondary | ICD-10-CM

## 2023-02-20 DIAGNOSIS — I2510 Atherosclerotic heart disease of native coronary artery without angina pectoris: Secondary | ICD-10-CM

## 2023-02-20 MED ORDER — VALSARTAN 40 MG PO TABS: 40.0000 mg | ORAL_TABLET | Freq: Every day | ORAL | 1 refills | Status: DC

## 2023-02-20 MED ORDER — LEVOTHYROXINE SODIUM 125 MCG PO TABS: 125.0000 ug | ORAL_TABLET | Freq: Every day | ORAL | 1 refills | Status: DC

## 2023-02-20 MED ORDER — OMEPRAZOLE 40 MG PO CPDR: 40.0000 mg | DELAYED_RELEASE_CAPSULE | Freq: Every day | ORAL | 1 refills | Status: DC

## 2023-02-20 NOTE — Patient Instructions (Addendum)
 Return in about 24 weeks (around 08/07/2023) for cpe (20 min), Routine chronic condition follow-up.        Great to see you today.  I have refilled the medication(s) we provide.   If labs were collected or images ordered, we will inform you of  results once we have received them and reviewed. We will contact you either by echart message, or telephone call.  Please give ample time to the testing facility, and our office to run,  receive and review results. Please do not call inquiring of results, even if you can see them in your chart. We will contact you as soon as we are able. If it has been over 1 week since the test was completed, and you have not yet heard from us , then please call us .    - echart message- for normal results that have been seen by the patient already.   - telephone call: abnormal results or if patient has not viewed results in their echart.  If a referral to a specialist was entered for you, please call us  in 2 weeks if you have not heard from the specialist office to schedule.     Plantar Fasciitis: What to Know  Your plantar fascia is a band of thick tissue on the bottom of your foot. It connects your heel bone to the base of your toes. If the fascia gets irritated, it can cause pain in your heel or foot. This is called plantar fasciitis. In some cases, plantar fasciitis can make it hard for you to walk or move. The pain is often worse in the morning after sleeping, or after sitting or lying down for a long time. Pain may also be worse after walking or standing for a long time. What are the causes? Plantar fasciitis may be caused by: Standing for a long time. Wearing shoes that don't have good arch support. Doing high-impact activities. These are things that put stress on your joints. They include: Ballet. Aerobic exercises. These are exercises that make your heart beat faster. Being overweight. Having a way of walking, or gait, that isn't normal. Tight muscles  in your calf, which is in the back of your lower leg. High arches in your feet, or flat feet. Starting a new sport or activity. What are the signs or symptoms? The main symptom of plantar fasciitis is heel pain. Your pain may get worse after: You take your first steps after a time of rest. This includes in the morning after you wake up, or after you've been sitting or lying down for a while. Standing still for a long time. Pain may lessen after 30-45 minutes of activity, such as gentle walking. How is this diagnosed? Plantar fasciitis may be diagnosed based on your medical history, your symptoms, and an exam. Your health care provider will check for: A tender spot on the bottom of your foot. A high arch in your foot, or flat feet. Pain when you move your foot. Trouble moving your foot. You may also have tests. These may include: X-rays. Ultrasound. MRI. How is this treated? Treatment depends on how bad your plantar fasciitis is. It may include: RICE therapy. This stands for rest, ice, pressure (compression), and raising (elevating) the foot. Exercises to stretch your calves and plantar fascia. A night splint. This holds your foot in a stretched, upward position while you sleep. Physical therapy. This can help with symptoms. It can also prevent problems in the future. Shots of a steroid medicine  called cortisone. This can help with pain and irritation. Extracorporeal shock wave therapy. This uses electric shocks to stimulate your plantar fascia. If other treatments don't help, you may need to have surgery. Follow these instructions at home: Managing pain, stiffness, and swelling  Use ice, an ice pack, or a frozen bottle of water as told. Place a towel between your skin and the ice. Roll the bottom of your foot over the ice or frozen bottle. Do this for 20 minutes, 2-3 times a day. If your skin turns red, take off the ice right away to prevent skin damage. The risk of damage is higher  if you can't feel pain, heat, or cold. Wear shoes that have air-sole or gel-sole cushions. You could also try soft shoe inserts made for plantar fasciitis. Activity Try not to do things that cause pain. Ask what things are safe for you to do. Exercise as told. Try activities that are low impact. This means that they're easier on your joints. They include: Swimming. Water aerobics. Biking. General instructions Take your medicines only as told. Wear a night splint as told. Loosen the splint if your toes tingle, are numb, or turn cold and blue. Stay at a healthy weight. Work with your provider to lose weight as needed. Contact a health care provider if: Your symptoms don't go away with treatment. You have pain that gets worse. Your pain makes it hard to move or do everyday things. This information is not intended to replace advice given to you by your health care provider. Make sure you discuss any questions you have with your health care provider. Document Revised: 06/04/2022 Document Reviewed: 06/04/2022 Elsevier Patient Education  2024 [employer].
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
## 2023-05-12 ENCOUNTER — Other Ambulatory Visit: Payer: Self-pay | Admitting: Cardiology

## 2023-05-22 ENCOUNTER — Ambulatory Visit: Payer: Medicare HMO

## 2023-05-22 VITALS — BP 146/84 | Ht 69.0 in | Wt 205.0 lb

## 2023-05-22 DIAGNOSIS — Z0000 Encounter for general adult medical examination without abnormal findings: Secondary | ICD-10-CM

## 2023-05-22 NOTE — Patient Instructions (Signed)
 Keith Thompson , Thank you for taking time to come for your Medicare Wellness Visit. I appreciate your ongoing commitment to your health goals. Please review the following plan we discussed and let me know if I can assist you in the future.   Referrals/Orders/Follow-Ups/Clinician Recommendations: follow up as scheduled for next AWV.  This is a list of the screening recommended for you and due dates:  Health Maintenance  Topic Date Due   Flu Shot  08/16/2023   Medicare Annual Wellness Visit  05/21/2024   Colon Cancer Screening  08/14/2027   DTaP/Tdap/Td vaccine (2 - Td or Tdap) 07/29/2031   Pneumonia Vaccine  Completed   Hepatitis C Screening  Completed   HPV Vaccine  Aged Out   Meningitis B Vaccine  Aged Out   COVID-19 Vaccine  Discontinued   Zoster (Shingles) Vaccine  Discontinued    Advanced directives: (Declined) Advance directive discussed with you today. Even though you declined this today, please call our office should you change your mind, and we can give you the proper paperwork for you to fill out.  Next Medicare Annual Wellness Visit scheduled for next year: Yes  Have you seen your provider in the last 6 months (3 months if uncontrolled diabetes)? Yes
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
## 2023-08-08 ENCOUNTER — Telehealth: Payer: Self-pay | Admitting: Family Medicine

## 2023-08-08 ENCOUNTER — Encounter: Payer: Self-pay | Admitting: Family Medicine

## 2023-08-08 ENCOUNTER — Ambulatory Visit: Payer: Medicare HMO | Admitting: Family Medicine

## 2023-08-08 VITALS — BP 138/88 | HR 66 | Ht 69.69 in | Wt 209.2 lb

## 2023-08-08 DIAGNOSIS — I739 Peripheral vascular disease, unspecified: Secondary | ICD-10-CM | POA: Diagnosis not present

## 2023-08-08 DIAGNOSIS — I10 Essential (primary) hypertension: Secondary | ICD-10-CM | POA: Diagnosis not present

## 2023-08-08 DIAGNOSIS — E559 Vitamin D deficiency, unspecified: Secondary | ICD-10-CM

## 2023-08-08 DIAGNOSIS — R4701 Aphasia: Secondary | ICD-10-CM

## 2023-08-08 DIAGNOSIS — E890 Postprocedural hypothyroidism: Secondary | ICD-10-CM

## 2023-08-08 DIAGNOSIS — R1906 Epigastric swelling, mass or lump: Secondary | ICD-10-CM | POA: Insufficient documentation

## 2023-08-08 DIAGNOSIS — E785 Hyperlipidemia, unspecified: Secondary | ICD-10-CM | POA: Diagnosis not present

## 2023-08-08 DIAGNOSIS — I6522 Occlusion and stenosis of left carotid artery: Secondary | ICD-10-CM

## 2023-08-08 DIAGNOSIS — Z5181 Encounter for therapeutic drug level monitoring: Secondary | ICD-10-CM

## 2023-08-08 DIAGNOSIS — Z0000 Encounter for general adult medical examination without abnormal findings: Secondary | ICD-10-CM | POA: Diagnosis not present

## 2023-08-08 DIAGNOSIS — R1900 Intra-abdominal and pelvic swelling, mass and lump, unspecified site: Secondary | ICD-10-CM | POA: Diagnosis not present

## 2023-08-08 DIAGNOSIS — Z9081 Acquired absence of spleen: Secondary | ICD-10-CM

## 2023-08-08 DIAGNOSIS — J386 Stenosis of larynx: Secondary | ICD-10-CM | POA: Diagnosis not present

## 2023-08-08 DIAGNOSIS — R251 Tremor, unspecified: Secondary | ICD-10-CM

## 2023-08-08 DIAGNOSIS — Z79899 Other long term (current) drug therapy: Secondary | ICD-10-CM

## 2023-08-08 DIAGNOSIS — Z125 Encounter for screening for malignant neoplasm of prostate: Secondary | ICD-10-CM | POA: Diagnosis not present

## 2023-08-08 DIAGNOSIS — Z85810 Personal history of malignant neoplasm of tongue: Secondary | ICD-10-CM

## 2023-08-08 DIAGNOSIS — I2510 Atherosclerotic heart disease of native coronary artery without angina pectoris: Secondary | ICD-10-CM

## 2023-08-08 HISTORY — DX: Aphasia: R47.01

## 2023-08-08 LAB — CBC
HCT: 47.4 % (ref 39.0–52.0)
Hemoglobin: 15.8 g/dL (ref 13.0–17.0)
MCHC: 33.3 g/dL (ref 30.0–36.0)
MCV: 99 fl (ref 78.0–100.0)
Platelets: 191 K/uL (ref 150.0–400.0)
RBC: 4.79 Mil/uL (ref 4.22–5.81)
RDW: 14.2 % (ref 11.5–15.5)
WBC: 6.4 K/uL (ref 4.0–10.5)

## 2023-08-08 LAB — TSH: TSH: 1.04 u[IU]/mL (ref 0.35–5.50)

## 2023-08-08 LAB — LIPID PANEL
Cholesterol: 128 mg/dL (ref 0–200)
HDL: 51.4 mg/dL (ref >39.00)
LDL Cholesterol: 60 mg/dL (ref 0–99)
NonHDL: 76.93
Total CHOL/HDL Ratio: 2
Triglycerides: 86 mg/dL (ref 0.0–149.0)
VLDL: 17.2 mg/dL (ref 0.0–40.0)

## 2023-08-08 LAB — COMPREHENSIVE METABOLIC PANEL WITH GFR
ALT: 22 U/L (ref 0–53)
AST: 23 U/L (ref 0–37)
Albumin: 4.3 g/dL (ref 3.5–5.2)
Alkaline Phosphatase: 58 U/L (ref 39–117)
BUN: 14 mg/dL (ref 6–23)
CO2: 30 meq/L (ref 19–32)
Calcium: 9.1 mg/dL (ref 8.4–10.5)
Chloride: 105 meq/L (ref 96–112)
Creatinine, Ser: 0.96 mg/dL (ref 0.40–1.50)
GFR: 80.16 mL/min (ref >60.00)
Glucose, Bld: 90 mg/dL (ref 70–99)
Potassium: 4.7 meq/L (ref 3.5–5.1)
Sodium: 142 meq/L (ref 135–145)
Total Bilirubin: 0.9 mg/dL (ref 0.2–1.2)
Total Protein: 6.7 g/dL (ref 6.0–8.3)

## 2023-08-08 LAB — MAGNESIUM: Magnesium: 2.1 mg/dL (ref 1.5–2.5)

## 2023-08-08 LAB — VITAMIN D 25 HYDROXY (VIT D DEFICIENCY, FRACTURES): VITD: 55 ng/mL (ref 30.00–100.00)

## 2023-08-08 LAB — B12 AND FOLATE PANEL
Folate: >23.4 ng/mL (ref >5.9)
Vitamin B-12: 929 pg/mL — ABNORMAL HIGH (ref 211–911)

## 2023-08-08 LAB — HEMOGLOBIN A1C: Hgb A1c MFr Bld: 6.2 % (ref 4.6–6.5)

## 2023-08-08 LAB — PSA, MEDICARE: PSA: 1.06 ng/mL (ref 0.10–4.00)

## 2023-08-08 MED ORDER — CLOPIDOGREL BISULFATE 75 MG PO TABS: 75.0000 mg | ORAL_TABLET | Freq: Every day | ORAL | 11 refills | Status: DC

## 2023-08-08 MED ORDER — OMEPRAZOLE 40 MG PO CPDR: 40.0000 mg | DELAYED_RELEASE_CAPSULE | Freq: Every day | ORAL | 1 refills | Status: DC

## 2023-08-08 MED ORDER — VALSARTAN 40 MG PO TABS: 20.0000 mg | ORAL_TABLET | Freq: Every day | ORAL | 1 refills | Status: DC

## 2023-08-08 NOTE — Telephone Encounter (Signed)
 Please call patient After discussion today surrounding his reported event last week, I did add back Plavix  to his medication regimen. He is to continue the ASA 81 mg and Plavix  for 90 days both, and after 90 days he will discontinue the aspirin  and stay on the Plavix  this time.  If somebody has a breakthrough neurological event, such as he described on baby aspirin , we switched him over to the other agent which is used-Plavix .   Please make sure he is able to repeat this back to you and understands instructions.
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
## 2023-08-08 NOTE — Telephone Encounter (Signed)
 Spoke with patient regarding results/recommendations.

## 2023-08-08 NOTE — Patient Instructions (Signed)
 Return in about 24 weeks (around 01/23/2024) for Routine chronic condition follow-up.        Great to see you today.  I have refilled the medication(s) we provide.   If labs were collected or images ordered, we will inform you of  results once we have received them and reviewed. We will contact you either by echart message, or telephone call.  Please give ample time to the testing facility, and our office to run,  receive and review results. Please do not call inquiring of results, even if you can see them in your chart. We will contact you as soon as we are able. If it has been over 1 week since the test was completed, and you have not yet heard from us , then please call us .    - echart message- for normal results that have been seen by the patient already.   - telephone call: abnormal results or if patient has not viewed results in their echart.  If a referral to a specialist was entered for you, please call us  in 2 weeks if you have not heard from the specialist office to schedule.

## 2023-08-09 ENCOUNTER — Ambulatory Visit: Payer: Self-pay | Admitting: Family Medicine

## 2023-08-09 MED ORDER — LEVOTHYROXINE SODIUM 125 MCG PO TABS: 125.0000 ug | ORAL_TABLET | Freq: Every day | ORAL | 1 refills | Status: DC

## 2023-08-11 ENCOUNTER — Ambulatory Visit (HOSPITAL_BASED_OUTPATIENT_CLINIC_OR_DEPARTMENT_OTHER)
Admission: RE | Admit: 2023-08-11 | Discharge: 2023-08-11 | Disposition: A | Discharge status: Home / Self Care | Source: Ambulatory Visit | Attending: Family Medicine | Admitting: Family Medicine

## 2023-08-11 DIAGNOSIS — R29818 Other symptoms and signs involving the nervous system: Secondary | ICD-10-CM | POA: Diagnosis not present

## 2023-08-11 DIAGNOSIS — N281 Cyst of kidney, acquired: Secondary | ICD-10-CM | POA: Diagnosis not present

## 2023-08-11 DIAGNOSIS — R251 Tremor, unspecified: Secondary | ICD-10-CM | POA: Diagnosis not present

## 2023-08-11 DIAGNOSIS — Z85810 Personal history of malignant neoplasm of tongue: Secondary | ICD-10-CM

## 2023-08-11 DIAGNOSIS — R1906 Epigastric swelling, mass or lump: Secondary | ICD-10-CM | POA: Insufficient documentation

## 2023-08-11 DIAGNOSIS — R4701 Aphasia: Secondary | ICD-10-CM

## 2023-08-11 DIAGNOSIS — R1900 Intra-abdominal and pelvic swelling, mass and lump, unspecified site: Secondary | ICD-10-CM | POA: Diagnosis not present

## 2023-08-11 DIAGNOSIS — K8020 Calculus of gallbladder without cholecystitis without obstruction: Secondary | ICD-10-CM | POA: Diagnosis not present

## 2023-08-11 DIAGNOSIS — I6782 Cerebral ischemia: Secondary | ICD-10-CM | POA: Diagnosis not present

## 2023-08-11 DIAGNOSIS — I6522 Occlusion and stenosis of left carotid artery: Secondary | ICD-10-CM | POA: Diagnosis not present

## 2023-08-11 MED ORDER — GADOBUTROL 1 MMOL/ML IV SOLN
9.0000 mL | Freq: Once | INTRAVENOUS | Status: AC | PRN
  Administered 2023-08-11: 9 mL via INTRAVENOUS

## 2023-08-19 ENCOUNTER — Other Ambulatory Visit

## 2023-08-21 ENCOUNTER — Other Ambulatory Visit

## 2023-09-30 NOTE — Progress Notes (Unsigned)
**Note Keith-Identified via Obfuscation**  Cardiology Office Note:  .   Date:  10/02/2023  ID:  Keith Thompson, DOB July 10, 1955, MRN [id number] PCP: Keith Thompson LABOR, DO  Keith Thompson Cardiologist:  Keith Schilling, MD {  History of Present Illness: .   [name] is a 68 y.o. male  with PMHx of CAD (cath 10/11/2022: mid LAD lesion 30%, proximal circumflex-mid circumflex 65%, proximal RCA 55% and LVEDP was mildly elevated. Medical management), HTN, PAD, HLD, OSA (2008: mild), Carotid artery stenosis (11/2022: right ICA 40-59% stenosis, left total occlusion of ICA, CCA, and ECA, Followed by vascular surgery, Keith Thompson)  who reports to Keith Thompson office for follow up.   Last seen in heartcare 12/2022 by Keith Beauvais, NP for follow-up eval of CAD and SOB.  Reported intermittent brief sharp chest discomfort that did not appear cardiac in nature and suspected precordial.  EKG showed NSR with incomplete RBBB with no significant changes. Also reported 1 episode of trembling while in deer stand for 2 minutes but this was related to BP.  Discussed his labile BP and modified valsartan  with alternating daily dosage of 40 mg 4 days/week and 60 mg 3 days/week with the recommendation to take extra 20 mg if his BP is high on 40 mg days.  Continue on ASA 81 mg daily, NTG as needed, Crestor  40 mg daily.  Today, reports ongoing intermittent brief sharp discomforts that do not seem related to cardiac and suspect precordial.  Also reports unchanged SOB with moderate exertion that is relieved with rest. EKG in office without any significant changes.  Patient is able to do yard work such as weed eating or push lawnmower as well as hunting without any significant concerns.  Patient reports self titration of  valsartan  due to labile home BPs of 60/40 to 160/90.  Currently taking valsartan  20 mg for 3 days followed by 40 mg for 2 days.  Notes when BP is low he feels very lethargic and dizzy, which generally improves after drinking water and eating salty  snacks but denies any syncope.  Notes when BP is high he feels very warm and flushed.  Reports concerned that since taking Plavix  he has noticed an elevation in his BP as well as nightly brief leg cramps.  Discussed that Plavix  generally does not affect BP and electrolytes are normal on most recent labs. Patient completed sleep study in 01/2023 and was diagnosed with mild OSA and is not using CPAP machine.  Denies any edema, active bleeding, palpitations.  Reports compliance with medications.  Reports low-sodium heart healthy diet.  Denies tobacco use/alcohol /drug use. Denies any recent hospitalizations or visits to the emergency department.   ROS: 10 point review of system has been reviewed and considered negative except ones been listed in the HPI.   Studies Reviewed: Keith Thompson   EKG Interpretation Date/Time:  Wednesday October 02 2023 09:14:57 EDT Ventricular Rate:  58 PR Interval:  136 QRS Duration:  112 QT Interval:  416 QTC Calculation: 408 R Axis:   -21  Text Interpretation: Sinus bradycardia Incomplete right bundle branch block Minimal voltage criteria for LVH, may be normal variant ( R in aVL ) When compared with ECG of 01-Jan-2023 10:12, No significant change was found Confirmed by Keith Thompson ([id number]) on 10/02/2023 9:17:05 AM   Zio Monitor 06/2022 The predominant rhythm was normal sinus. There were infrequent runs of supraventricular tachycardia with the longest being 7 beats. There was 1 pause of 3.3 seconds There was rare ventricular ectopy There were  no sustained arrhythmias There were no patient triggered events  LHC 09/2022   Mid LAD lesion is 30% stenosed.   Prox Cx to Mid Cx lesion is 65% stenosed.   Prox RCA lesion is 55% stenosed.   LV end diastolic pressure is mildly elevated. Nonobstructive CAD. RFR across proximal RCA and mid LCx lesions was normal Mildly elevated LVEDP Plan: recommend medical management.  Carotid ultrasound 11/27/2022 Summary:  Right Carotid:  Velocities in the right ICA are consistent with a 40-59%                 stenosis. Left Evidence consistent with a total occlusion of the left  ICA,CCA and  Carotid:     ECA.   Vertebrals:  Bilateral vertebral arteries demonstrate antegrade flow. The  right  vertebral is near absent.  Subclavians: Normal flow hemodynamics were seen in bilateral subclavian               arteries.   Echocardiogram 12/03/2022 IMPRESSIONS   1. Left ventricular ejection fraction, by estimation, is 60 to 65%. The  left ventricle has normal function. The left ventricle has no regional  wall motion abnormalities. Left ventricular diastolic parameters are  consistent with Grade I diastolic  dysfunction (impaired relaxation).   2. Right ventricular systolic function is normal. The right ventricular  size is mildly enlarged.   3. Left atrial size was mildly dilated.   4. The mitral valve is normal in structure. No evidence of mitral valve  regurgitation. No evidence of mitral stenosis.   5. The aortic valve is tricuspid. Aortic valve regurgitation is mild. No  aortic stenosis is present.   6. The inferior vena cava is normal in size with greater than 50%  respiratory variability, suggesting right atrial pressure of 3 mmHg.  Risk Assessment/Calculations:   STOP-Bang Score:  6  {   Physical Exam:   VS:  BP 130/82   Pulse (!) 58   Ht 5' 9 (1.753 m)   Wt 207 lb 6.4 oz (94.1 kg)   SpO2 96%   BMI 30.63 kg/m    Wt Readings from Last 3 Encounters:  10/02/23 207 lb 6.4 oz (94.1 kg)  08/08/23 209 lb 3.2 oz (94.9 kg)  05/22/23 205 lb (93 kg)    GEN: Well nourished, well developed in no acute distress while sitting in chair. Accompanied by wife. NECK: No JVD; No carotid bruits CARDIAC: RRR, no murmurs, rubs, gallops RESPIRATORY:  Clear to auscultation without rales, wheezing or rhonchi  ABDOMEN: Soft, non-tender, non-distended EXTREMITIES:  No edema; No deformity   ASSESSMENT AND PLAN: .   Coronary artery  disease involving native coronary artery of native heart without angina pectoris Hyperlipidemia LDL goal <70 Coronary CTA 0/75 showed borderline FFR in his RCA, LAD and circumflex. His coronary calcium  score was 889 which placed him in the 84th percentile for age and sex matched control. His CAD RADS was 3.  LHC 10/11/2022 by Dr. [place]: mid LAD lesion 30%, proximal circumflex-mid circumflex 65%, proximal RCA 55% and LVEDP was mildly elevated. Medical management was recommended.  Reports ongoing intermittent brief sharp discomforts that do not seem related to cardiac and suspect precordial.  EKG showed sinus bradycardia without significant changes.  Denies any anginal symptoms.  No need for further ischemic evaluation at this time. K 4.7, Cr 0.96, LDL 70 and LFT WNL 07/2023. Continue on ASA 81 mg daily, NTG as needed, Crestor  40 mg daily, valsartan  20 mg for 3 days followed  by 40 mg for 2 days.  Essential hypertension Reports labile home Bps of 60/40 to 160/90.  Currently taking valsartan  20 mg for 3 days followed by 40 mg for 2 days.  BP this OV well controlled today: 130/82 Continue on valsartan  as above.  Discussed switching to different BP medication or adding additional medication when BP significantly elevated.  Patient prefers to continue to self titrate valsartan .   Order renal arterial ultrasound for secondary hypertension etiology.  If no significant findings and ongoing labile BPs then would consider referral to advanced hypertension clinic. Encourage physical activity for 150 minutes per week and heart healthy low sodium diet. Discussed limiting sodium intake to < 2 grams daily.     Shortness of breath Echocardiogram 12/03/2022 showed an LVEF of 60-65%, G1 DD, mildly dilated left atria, and no significant valvular abnormalities.  Reports unchanged SOB with moderate exertion that is relieved with rest.  Increase physical activity as tolerated. Heart healthy low-sodium  diet  Asymptomatic carotid artery stenosis without infarction, right Left carotid artery occlusion Follows with vascular surgery, Keith Thompson   OSA (obstructive sleep apnea) Patient completed sleep study in 01/2023 and was diagnosed with mild OSA and is not using CPAP machine.  Discussed OSA could be contributing to labile hypertension. Avoid supine sleeping, Elevate head of bed, maintain healthy weight, sleep hygiene instructions    Dispo: Follow up with Dr. Lavona after Renal Arterial US .   Signed, Lorette CINDERELLA Kapur, PA-C

## 2023-10-02 ENCOUNTER — Ambulatory Visit: Attending: Cardiology | Admitting: Physician Assistant

## 2023-10-02 ENCOUNTER — Encounter: Payer: Self-pay | Admitting: General Practice

## 2023-10-02 VITALS — BP 130/82 | HR 58 | Ht 69.0 in | Wt 207.4 lb

## 2023-10-02 DIAGNOSIS — I2510 Atherosclerotic heart disease of native coronary artery without angina pectoris: Secondary | ICD-10-CM | POA: Diagnosis not present

## 2023-10-02 DIAGNOSIS — G4733 Obstructive sleep apnea (adult) (pediatric): Secondary | ICD-10-CM

## 2023-10-02 DIAGNOSIS — I6522 Occlusion and stenosis of left carotid artery: Secondary | ICD-10-CM | POA: Diagnosis not present

## 2023-10-02 DIAGNOSIS — E785 Hyperlipidemia, unspecified: Secondary | ICD-10-CM | POA: Diagnosis not present

## 2023-10-02 DIAGNOSIS — I10 Essential (primary) hypertension: Secondary | ICD-10-CM | POA: Diagnosis not present

## 2023-10-02 DIAGNOSIS — I6521 Occlusion and stenosis of right carotid artery: Secondary | ICD-10-CM

## 2023-10-02 DIAGNOSIS — R0602 Shortness of breath: Secondary | ICD-10-CM

## 2023-10-02 NOTE — Patient Instructions (Signed)
 Medication Instructions:  Your physician recommends that you continue on your current medications as directed. Please refer to the Current Medication list given to you today.  *If you need a refill on your cardiac medications before your next appointment, please call your pharmacy*  Lab Work: NONE If you have labs (blood work) drawn today and your tests are completely normal, you will receive your results only by: MyChart Message (if you have MyChart) OR A paper copy in the mail If you have any lab test that is abnormal or we need to change your treatment, we will call you to review the results.  Testing/Procedures: Your physician has requested that you have a renal artery duplex. During this test, an ultrasound is used to evaluate blood flow to the kidneys. Allow one hour for this exam. Do not eat after midnight the day before and avoid carbonated beverages. Take your medications as you usually do.   Follow-Up: At [facility], you and your health needs are our priority.  As part of our continuing mission to provide you with exceptional heart care, our providers are all part of one team.  This team includes your primary Cardiologist (physician) and Advanced Practice Providers or APPs (Physician Assistants and Nurse Practitioners) who all work together to provide you with the care you need, when you need it.  Your next appointment:   AFTER TESTING IS COMPLETE   Provider:   DR. LAVONA   Other Instructions  Healthy Living: Sleep In this video, you will learn why sleep is an important part of a healthy lifestyle. To view the content, go to this web address: [email or web address]  This video will expire on: 12/26/2024. If you need access to this video following this date, please reach out to the healthcare provider who assigned it to you. This information is not intended to replace advice given to you by your health care provider. Make sure you discuss any questions  you have with your health care provider. Elsevier Patient Education  2024 [employer].

## 2023-11-02 ENCOUNTER — Other Ambulatory Visit: Payer: Self-pay | Admitting: Cardiology

## 2023-11-04 ENCOUNTER — Ambulatory Visit (HOSPITAL_COMMUNITY)
Admission: RE | Admit: 2023-11-04 | Discharge: 2023-11-04 | Disposition: A | Discharge status: Home / Self Care | Source: Ambulatory Visit | Attending: General Practice | Admitting: General Practice

## 2023-11-04 ENCOUNTER — Ambulatory Visit (HOSPITAL_BASED_OUTPATIENT_CLINIC_OR_DEPARTMENT_OTHER)
Admission: RE | Admit: 2023-11-04 | Discharge: 2023-11-04 | Disposition: A | Discharge status: Home / Self Care | Source: Ambulatory Visit | Attending: Vascular Surgery | Admitting: Vascular Surgery

## 2023-11-04 DIAGNOSIS — I6522 Occlusion and stenosis of left carotid artery: Secondary | ICD-10-CM | POA: Diagnosis not present

## 2023-11-04 DIAGNOSIS — I10 Essential (primary) hypertension: Secondary | ICD-10-CM | POA: Insufficient documentation

## 2023-11-06 ENCOUNTER — Ambulatory Visit: Payer: Self-pay | Admitting: General Practice

## 2023-11-13 NOTE — Progress Notes (Unsigned)
 Cardiology Office Note:   Date:  11/14/2023  ID:  DALON REICHART, DOB 1955-07-20, MRN [id number] PCP: Catherine Charlies LABOR, DO  [facility] Providers Cardiologist:  Lynwood Schilling, MD {  History of Present Illness:   Keith Thompson is a 68 y.o. male who is referred for evaluation of dyslipidemia, PAD and chest pain.  He had a POET (Plain Old Exercise Treadmill) without ischemia but with PVCs.  He wore a Zio .  He had NSR with PACs and not frequent PVCs.    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  He has been noted to have coronary calcium  before on his CT done to rule out pulmonary embolism.  He has aortic atherosclerosis.  He is continuing to get chest discomfort for which she had his treadmill test.    He was last seen in the clinic in September 2025.  He had some labile blood pressures.  He had some intermittent sharp chest discomfort.  Renal Doppler was ordered.  This did not demonstrate any stenosis.    He does report that his BPs might go up to 160s but he will check it again in 15 minutes or so and it will come back down.  Occasionally it is dropped and he said at 1 point in time it was 62/43 and he felt presyncopal.  He is learning to take some salt and a little sugar and it comes right back up.  For the most part he says it is well-managed with systolics in the 120s 130s.  He does get some shortness of breath with activity such as walking up an incline in his yard but he does yard work and this is not routine.  He is not getting any chest pressure, neck or arm discomfort.  He is not having any new resting shortness of breath, PND or orthopnea.  He said no palpitations, presyncope or syncope.  Essentially things are not particularly changed from his catheterization in September 2024.   ROS: As stated in the HPI and negative for all other systems.   Studies Reviewed:    EKG:     NA  Risk Assessment/Calculations:           Physical Exam:   VS:  BP 102/68   Pulse 73   Ht 5' 9.5 (1.765 m)   Wt 209 lb 6.4 oz (95 kg)   SpO2 97%   BMI 30.48 kg/m    Wt Readings from Last 3 Encounters:  11/14/23 209 lb 6.4 oz (95 kg)  10/02/23 207 lb 6.4 oz (94.1 kg)  08/08/23 209 lb 3.2 oz (94.9 kg)     GEN: Well nourished, well developed in no acute distress NECK: No JVD; No carotid bruits CARDIAC: RRR, no murmurs, rubs, gallops RESPIRATORY:  Clear to auscultation without rales, wheezing or rhonchi  ABDOMEN: Soft, non-tender, non-distended EXTREMITIES:  No edema; No deformity   ASSESSMENT AND PLAN:   Coronary artery disease:   The patient has no new sypmtoms.  No further cardiovascular testing is indicated.  We will continue with aggressive risk reduction and meds as listed.   Shortness of breath: This seems to be at baseline.  No change in therapy.  Hyperlipidemia: 60 with an HDL of 51.  No change in therapy.  Elevated A1c: His A1c was 6.2.  We talked about diet.    I have suggested low processed wheat flour diet   Carotid  artery disease:    Last week the patient was found to have less than 30% right ICA and  left total occlusion of ICA.  He will continue with risk reduction   OSA: This is mild and managed conservatively.    Follow up with Josefa Beauvais NP in 6 months.   Signed, Lynwood Schilling, MD

## 2023-11-14 ENCOUNTER — Ambulatory Visit: Attending: Internal Medicine | Admitting: Cardiology

## 2023-11-14 ENCOUNTER — Encounter: Payer: Self-pay | Admitting: Cardiology

## 2023-11-14 VITALS — BP 102/68 | HR 73 | Ht 69.5 in | Wt 209.4 lb

## 2023-11-14 DIAGNOSIS — I10 Essential (primary) hypertension: Secondary | ICD-10-CM | POA: Diagnosis not present

## 2023-11-14 DIAGNOSIS — I2510 Atherosclerotic heart disease of native coronary artery without angina pectoris: Secondary | ICD-10-CM

## 2023-11-14 DIAGNOSIS — G4733 Obstructive sleep apnea (adult) (pediatric): Secondary | ICD-10-CM

## 2023-11-14 DIAGNOSIS — E785 Hyperlipidemia, unspecified: Secondary | ICD-10-CM | POA: Diagnosis not present

## 2023-11-14 DIAGNOSIS — I6522 Occlusion and stenosis of left carotid artery: Secondary | ICD-10-CM | POA: Diagnosis not present

## 2023-11-14 NOTE — Patient Instructions (Signed)
 Medication Instructions:  Your physician recommends that you continue on your current medications as directed. Please refer to the Current Medication list given to you today.  *If you need a refill on your cardiac medications before your next appointment, please call your pharmacy*  Lab Work: NONE If you have labs (blood work) drawn today and your tests are completely normal, you will receive your results only by: MyChart Message (if you have MyChart) OR A paper copy in the mail If you have any lab test that is abnormal or we need to change your treatment, we will call you to review the results.  Testing/Procedures: NONE  Follow-Up: At [facility], you and your health needs are our priority.  As part of our continuing mission to provide you with exceptional heart care, our providers are all part of one team.  This team includes your primary Cardiologist (physician) and Advanced Practice Providers or APPs (Physician Assistants and Nurse Practitioners) who all work together to provide you with the care you need, when you need it.  Your next appointment:   6 month(s)  Provider:   Josefa Beauvais, NP  We recommend signing up for the patient portal called MyChart.  Sign up information is provided on this After Visit Summary.  MyChart is used to connect with patients for Virtual Visits (Telemedicine).  Patients are able to view lab/test results, encounter notes, upcoming appointments, etc.  Non-urgent messages can be sent to your provider as well.   To learn more about what you can do with MyChart, go to [email or web address].

## 2023-11-29 ENCOUNTER — Other Ambulatory Visit: Payer: Self-pay | Admitting: Cardiology

## 2024-01-20 ENCOUNTER — Other Ambulatory Visit: Payer: Self-pay

## 2024-01-20 ENCOUNTER — Emergency Department (HOSPITAL_BASED_OUTPATIENT_CLINIC_OR_DEPARTMENT_OTHER): Admission: EM | Admit: 2024-01-20 | Discharge: 2024-01-20 | Disposition: A | Discharge status: Home / Self Care

## 2024-01-20 ENCOUNTER — Ambulatory Visit: Payer: Self-pay

## 2024-01-20 ENCOUNTER — Encounter (HOSPITAL_BASED_OUTPATIENT_CLINIC_OR_DEPARTMENT_OTHER): Payer: Self-pay

## 2024-01-20 DIAGNOSIS — E039 Hypothyroidism, unspecified: Secondary | ICD-10-CM | POA: Diagnosis not present

## 2024-01-20 DIAGNOSIS — Z7982 Long term (current) use of aspirin: Secondary | ICD-10-CM | POA: Diagnosis not present

## 2024-01-20 DIAGNOSIS — Z7902 Long term (current) use of antithrombotics/antiplatelets: Secondary | ICD-10-CM | POA: Diagnosis not present

## 2024-01-20 DIAGNOSIS — I10 Essential (primary) hypertension: Secondary | ICD-10-CM | POA: Insufficient documentation

## 2024-01-20 LAB — CBC WITH DIFFERENTIAL/PLATELET
Abs Immature Granulocytes: 0.02 K/uL (ref 0.00–0.07)
Basophils Absolute: 0.1 K/uL (ref 0.0–0.1)
Basophils Relative: 1 %
Eosinophils Absolute: 0.1 K/uL (ref 0.0–0.5)
Eosinophils Relative: 1 %
HCT: 45.2 % (ref 39.0–52.0)
Hemoglobin: 15.5 g/dL (ref 13.0–17.0)
Immature Granulocytes: 0 %
Lymphocytes Relative: 27 %
Lymphs Abs: 2.5 K/uL (ref 0.7–4.0)
MCH: 33.3 pg (ref 26.0–34.0)
MCHC: 34.3 g/dL (ref 30.0–36.0)
MCV: 97 fL (ref 80.0–100.0)
Monocytes Absolute: 1.2 K/uL — ABNORMAL HIGH (ref 0.1–1.0)
Monocytes Relative: 13 %
Neutro Abs: 5.2 K/uL (ref 1.7–7.7)
Neutrophils Relative %: 58 %
Platelets: 206 K/uL (ref 150–400)
RBC: 4.66 MIL/uL (ref 4.22–5.81)
RDW: 13.3 % (ref 11.5–15.5)
WBC: 9 K/uL (ref 4.0–10.5)
nRBC: 0 % (ref 0.0–0.2)

## 2024-01-20 LAB — BASIC METABOLIC PANEL WITH GFR
Anion gap: 9 (ref 5–15)
BUN: 15 mg/dL (ref 8–23)
CO2: 27 mmol/L (ref 22–32)
Calcium: 9.1 mg/dL (ref 8.9–10.3)
Chloride: 104 mmol/L (ref 98–111)
Creatinine, Ser: 0.85 mg/dL (ref 0.61–1.24)
GFR, Estimated: >60 mL/min
Glucose, Bld: 92 mg/dL (ref 70–99)
Potassium: 3.9 mmol/L (ref 3.5–5.1)
Sodium: 140 mmol/L (ref 135–145)

## 2024-01-20 NOTE — ED Triage Notes (Signed)
 Pt states that his blood pressure has been increasing over the weekend. States that he called his PCP and they told him to come to the ED for further evaluation.

## 2024-01-20 NOTE — ED Provider Notes (Signed)
 " [place] [facility] AT [facility] Provider Note   CSN: [id number] Arrival date & time: 01/20/24  1204     Patient presents with: Hypertension   Keith Thompson is a 69 y.o. male. Patient with past medical history of status post splenectomy, hypertension, hyperlipidemia, hypothyroidism presents emergency room with complaints of hypertension.  Patient reports that he checked his blood pressure over the last few days because his face was feeling very hot and flushed.  He was found to have elevated blood pressure at home with 200 systolics.  Here he has been maintaining systolic of 140-120 range.  He denies any significant headache, focal deficit, chest pain, shortness of breath or other symptoms. Has been talking Valsartan .     Hypertension       Prior to Admission medications  Medication Sig Start Date End Date Taking? Authorizing Provider  clopidogrel  (PLAVIX ) 75 MG tablet Take 1 tablet (75 mg total) by mouth daily. 08/08/23  Yes Kuneff, Renee A, DO  levothyroxine  (SYNTHROID ) 125 MCG tablet Take 1 tablet (125 mcg total) by mouth daily. 08/09/23  Yes Kuneff, Renee A, DO  valsartan  (DIOVAN ) 40 MG tablet Take 0.5-1.5 tablets (20-60 mg total) by mouth daily. 08/08/23  Yes Kuneff, Renee A, DO  aspirin  EC 81 MG tablet Take 1 tablet (81 mg total) by mouth daily. Swallow whole. 05/31/22   Lavona Agent, MD  betamethasone  valerate ointment (VALISONE ) 0.1 % Apply topically 2 (two) times daily. Patient taking differently: Apply 1 Application topically at bedtime. 01/26/21   Kuneff, Renee A, DO  Cholecalciferol (VITAMIN D3) 50 MCG (2000 UT) capsule Take 2,000 Units by mouth daily.    [provider]  Coenzyme Q10 (CO Q 10 PO) Take 300 mg by mouth daily.    [provider]  cyanocobalamin  (VITAMIN B12) 1000 MCG tablet Take 1,000 mcg by mouth daily.    [provider]  DENTA 5000 PLUS 1.1 % CREA dental cream PLACE 1 APPLICATION ONTO TEETH AT BEDTIME.  01/29/22   Kuneff, Renee A, DO  diclofenac  Sodium (VOLTAREN ) 1 % GEL APPLY 4 G TOPICALLY 4 TIMES DAILY Patient taking differently: Apply 1 Application topically 4 (four) times daily. 04/16/22   Kuneff, Renee A, DO  Multiple Vitamin (MULTIVITAMIN) tablet Take 1 tablet by mouth daily.    [provider]  nitroGLYCERIN  (NITROSTAT ) 0.4 MG SL tablet Place 1 tablet (0.4 mg total) under the tongue every 5 (five) minutes as needed for chest pain. 11/01/22 11/14/23  Rana Lum CROME, NP  omeprazole  (PRILOSEC) 40 MG capsule Take 1 capsule (40 mg total) by mouth daily. 08/08/23   Kuneff, Renee A, DO  rosuvastatin  (CRESTOR ) 40 MG tablet Take 1 tablet (40 mg total) by mouth at bedtime. 12/03/23   Lavona Agent, MD    Allergies: Penicillins    Review of Systems  Constitutional:  Positive for activity change.    Updated Vital Signs BP (!) 141/97   Pulse 74   Temp 97.7 F (36.5 C) (Oral)   Resp 18   Ht 5' 9 (1.753 m)   Wt 97.5 kg   SpO2 99%   BMI 31.75 kg/m   Physical Exam Vitals and nursing note reviewed.  Constitutional:      General: He is not in acute distress.    Appearance: He is not toxic-appearing.  HENT:     Head: Normocephalic and atraumatic.  Eyes:     General: No scleral icterus.    Conjunctiva/sclera: Conjunctivae normal.  Cardiovascular:  Rate and Rhythm: Normal rate and regular rhythm.     Pulses: Normal pulses.     Heart sounds: Normal heart sounds.  Pulmonary:     Effort: Pulmonary effort is normal. No respiratory distress.     Breath sounds: Normal breath sounds.  Abdominal:     General: Abdomen is flat. Bowel sounds are normal.     Palpations: Abdomen is soft.     Tenderness: There is no abdominal tenderness.  Musculoskeletal:     Right lower leg: No edema.     Left lower leg: No edema.  Skin:    General: Skin is warm and dry.     Findings: No lesion.  Neurological:     General: No focal deficit present.     Mental Status: He is alert and  oriented to person, place, and time. Mental status is at baseline.     Comments: NO focal deficit.      (all labs ordered are listed, but only abnormal results are displayed) Labs Reviewed  CBC WITH DIFFERENTIAL/PLATELET - Abnormal; Notable for the following components:      Result Value   Monocytes Absolute 1.2 (*)    All other components within normal limits  BASIC METABOLIC PANEL WITH GFR    EKG: None  Radiology: No results found.   Procedures   Medications Ordered in the ED - No data to display                                  Medical Decision Making Amount and/or Complexity of Data Reviewed Labs: ordered.   This patient presents to the ED for concern of HTN, this involves an extensive number of treatment options, and is a complaint that carries with it a high risk of complications and morbidity.  The differential diagnosis includes CVA, ACS, PE, HTN emergency    Lab Tests:  I personally interpreted labs.  The pertinent results include:   CBC, BMP unremarkable.     Cardiac Monitoring: / EKG:  The patient was maintained on a cardiac monitor.  EKG shows LVH, no ischemia.    Problem List / ED Course / Critical interventions / Medication management  Patient comes in with complaint of hypertension.  Patient reports that his face was feeling hot and flushed which provoked him to check his blood pressure.  He reports he has intermittently been having elevated blood pressure at home.  He denies any headache or focal deficit.  No chest pain.  His labs show no sign of endorgan damage.  Symptoms consistent with asymptomatic hypertension.  On arrival hemodynamically stable and well-appearing.  His blood pressure was initially 160 when he checked it but it is currently 120/80 in the room when I am speaking to him. Patient hemodynamically stable and well-appearing throughout stay.  He has not had elevated blood pressure readings here, thus will not make any medication changes  at this time.  Cardiology manages his blood pressure so we will follow-up with them for recheck.  Given return precautions.        Final diagnoses:  Asymptomatic hypertension    ED Discharge Orders     None          Shermon Warren SAILOR, PA-C 01/20/24 1457    Neysa Caron PARAS, DO 01/20/24 1533  "

## 2024-01-20 NOTE — Discharge Instructions (Signed)
 Given that your cardiologist is managing your blood pressure.  I would recommend calling them to schedule an appointment for follow-up.  In the meantime please check your blood pressure 1-2 times daily and record your blood pressure readings.  Please bring these measurements to cardiology or primary care upon following up.  Return to ER with severe headache, stroke symptoms, chest pain or worsening symptoms.

## 2024-01-20 NOTE — Telephone Encounter (Signed)
 FYI Only or Action Required?: FYI only for provider: ED advised.  Patient was last seen in primary care on 08/08/2023 by Catherine Fuller A, DO.  Called Nurse Triage reporting Hypertension.  Triage Disposition: Go to ED Now (Notify PCP)  Patient/caregiver understands and will follow disposition?: Yes            Copied from CRM [id number]. Topic: Clinical - Red Word Triage >> Jan 20, 2024 10:54 AM Adelita BRAVO wrote: [facility] that prompted transfer to Nurse Triage: Elevated blood pressure. Reading from today was 209/118 and yesterday was 229/130 at 8:35pm. Patient stated his face is warm and has a slight headache. Reason for Disposition  [1] Systolic BP >= 160 OR Diastolic >= 100 AND [2] cardiac (e.g., breathing difficulty, chest pain) or neurologic symptoms (e.g., new-onset blurred or double vision, unsteady gait)  Answer Assessment - Initial Assessment Questions This RN recommends pt goes to ED with another adult driving pt there. Pt agreeable.  Elevated BP x3-4 days 215/136 right now, HR 79 Pt takes BP medication, dosage decreased about 6 months ago Headache Face is red  Protocols used: Blood Pressure - High-A-AH

## 2024-01-22 ENCOUNTER — Other Ambulatory Visit: Payer: Self-pay | Admitting: Family Medicine

## 2024-01-22 NOTE — Telephone Encounter (Signed)
 Pt has appt tomorrow, 1/8

## 2024-01-23 ENCOUNTER — Ambulatory Visit: Admitting: Family Medicine

## 2024-01-23 ENCOUNTER — Encounter: Payer: Self-pay | Admitting: Family Medicine

## 2024-01-23 VITALS — BP 132/82 | HR 75 | Temp 98.2°F | Wt 212.6 lb

## 2024-01-23 DIAGNOSIS — Z5181 Encounter for therapeutic drug level monitoring: Secondary | ICD-10-CM

## 2024-01-23 DIAGNOSIS — G4733 Obstructive sleep apnea (adult) (pediatric): Secondary | ICD-10-CM | POA: Diagnosis not present

## 2024-01-23 DIAGNOSIS — J386 Stenosis of larynx: Secondary | ICD-10-CM | POA: Diagnosis not present

## 2024-01-23 DIAGNOSIS — E890 Postprocedural hypothyroidism: Secondary | ICD-10-CM

## 2024-01-23 DIAGNOSIS — I6522 Occlusion and stenosis of left carotid artery: Secondary | ICD-10-CM

## 2024-01-23 DIAGNOSIS — E785 Hyperlipidemia, unspecified: Secondary | ICD-10-CM | POA: Diagnosis not present

## 2024-01-23 DIAGNOSIS — E559 Vitamin D deficiency, unspecified: Secondary | ICD-10-CM

## 2024-01-23 DIAGNOSIS — I679 Cerebrovascular disease, unspecified: Secondary | ICD-10-CM

## 2024-01-23 DIAGNOSIS — I739 Peripheral vascular disease, unspecified: Secondary | ICD-10-CM

## 2024-01-23 DIAGNOSIS — R232 Flushing: Secondary | ICD-10-CM

## 2024-01-23 DIAGNOSIS — Z79899 Other long term (current) drug therapy: Secondary | ICD-10-CM | POA: Diagnosis not present

## 2024-01-23 DIAGNOSIS — Z23 Encounter for immunization: Secondary | ICD-10-CM | POA: Diagnosis not present

## 2024-01-23 DIAGNOSIS — I10 Essential (primary) hypertension: Secondary | ICD-10-CM

## 2024-01-23 DIAGNOSIS — Z85810 Personal history of malignant neoplasm of tongue: Secondary | ICD-10-CM

## 2024-01-23 DIAGNOSIS — Z8673 Personal history of transient ischemic attack (TIA), and cerebral infarction without residual deficits: Secondary | ICD-10-CM | POA: Insufficient documentation

## 2024-01-23 DIAGNOSIS — Z9081 Acquired absence of spleen: Secondary | ICD-10-CM

## 2024-01-23 LAB — T4, FREE: Free T4: 1.09 ng/dL (ref 0.60–1.60)

## 2024-01-23 LAB — TSH: TSH: 3.58 u[IU]/mL (ref 0.35–5.50)

## 2024-01-23 LAB — T3, FREE: T3, Free: 3.4 pg/mL (ref 2.3–4.2)

## 2024-01-23 MED ORDER — VALSARTAN 40 MG PO TABS: ORAL_TABLET | ORAL | 1 refills | Status: AC

## 2024-01-23 MED ORDER — LEVOTHYROXINE SODIUM 125 MCG PO TABS: 125.0000 ug | ORAL_TABLET | Freq: Every day | ORAL | 1 refills | Status: AC

## 2024-01-23 MED ORDER — CLOPIDOGREL BISULFATE 75 MG PO TABS: 75.0000 mg | ORAL_TABLET | Freq: Every day | ORAL | 3 refills | Status: AC

## 2024-01-23 MED ORDER — OMEPRAZOLE 40 MG PO CPDR: 40.0000 mg | DELAYED_RELEASE_CAPSULE | Freq: Every day | ORAL | 1 refills | Status: AC
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
## 2024-01-23 NOTE — Patient Instructions (Addendum)
 Return in about 7 months (around 08/10/2024).        Great to see you today.  I have refilled the medication(s) we provide.   If labs were collected or images ordered, we will inform you of  results once we have received them and reviewed. We will contact you either by echart message, or telephone call.  Please give ample time to the testing facility, and our office to run,  receive and review results. Please do not call inquiring of results, even if you can see them in your chart. We will contact you as soon as we are able. If it has been over 1 week since the test was completed, and you have not yet heard from us , then please call us .    - echart message- for normal results that have been seen by the patient already.   - telephone call: abnormal results or if patient has not viewed results in their echart.  If a referral to a specialist was entered for you, please call us  in 2 weeks if you have not heard from the specialist office to schedule.

## 2024-01-27 ENCOUNTER — Ambulatory Visit: Payer: Self-pay | Admitting: Family Medicine

## 2024-05-15 ENCOUNTER — Ambulatory Visit: Admitting: General Practice

## 2024-05-27 ENCOUNTER — Ambulatory Visit

## 2024-08-10 ENCOUNTER — Ambulatory Visit: Admitting: Family Medicine
